# Patient Record
Sex: Male | Born: 1970 | Race: Black or African American | Hispanic: No | Smoking: Current every day smoker
Health system: Southern US, Community
[De-identification: ages and names within clinical notes are randomized; demographics above are authoritative.]

## PROBLEM LIST (undated history)

## (undated) DIAGNOSIS — K859 Acute pancreatitis without necrosis or infection, unspecified: Secondary | ICD-10-CM

## (undated) DIAGNOSIS — F1010 Alcohol abuse, uncomplicated: Secondary | ICD-10-CM

## (undated) DIAGNOSIS — Z720 Tobacco use: Secondary | ICD-10-CM

---

## 2002-07-18 ENCOUNTER — Emergency Department (HOSPITAL_COMMUNITY): Admission: EM | Admit: 2002-07-18 | Discharge: 2002-07-18 | Payer: Self-pay | Admitting: Emergency Medicine

## 2005-01-27 ENCOUNTER — Emergency Department: Payer: Self-pay | Admitting: Emergency Medicine

## 2006-01-12 ENCOUNTER — Emergency Department: Payer: Self-pay | Admitting: Emergency Medicine

## 2007-12-02 ENCOUNTER — Emergency Department: Payer: Self-pay | Admitting: Emergency Medicine

## 2007-12-02 ENCOUNTER — Other Ambulatory Visit: Payer: Self-pay

## 2007-12-04 ENCOUNTER — Inpatient Hospital Stay: Payer: Self-pay | Admitting: Internal Medicine

## 2008-07-11 ENCOUNTER — Emergency Department: Payer: Self-pay | Admitting: Emergency Medicine

## 2010-02-12 ENCOUNTER — Emergency Department (HOSPITAL_COMMUNITY): Admission: EM | Admit: 2010-02-12 | Discharge: 2010-02-13 | Payer: Self-pay | Admitting: Emergency Medicine

## 2010-08-21 LAB — COMPREHENSIVE METABOLIC PANEL
Alkaline Phosphatase: 47 U/L (ref 39–117)
BUN: 3 mg/dL — ABNORMAL LOW (ref 6–23)
Calcium: 9.2 mg/dL (ref 8.4–10.5)
Creatinine, Ser: 0.89 mg/dL (ref 0.4–1.5)
Glucose, Bld: 89 mg/dL (ref 70–99)
Total Protein: 7.1 g/dL (ref 6.0–8.3)

## 2010-08-21 LAB — CBC
HCT: 42.9 % (ref 39.0–52.0)
MCHC: 35.4 g/dL (ref 30.0–36.0)
MCV: 99.5 fL (ref 78.0–100.0)
RDW: 13.4 % (ref 11.5–15.5)

## 2010-08-21 LAB — DIFFERENTIAL
Basophils Absolute: 0 10*3/uL (ref 0.0–0.1)
Eosinophils Absolute: 0.1 10*3/uL (ref 0.0–0.7)
Eosinophils Relative: 2 % (ref 0–5)
Lymphs Abs: 0.9 10*3/uL (ref 0.7–4.0)
Monocytes Absolute: 0.3 10*3/uL (ref 0.1–1.0)
Neutro Abs: 1.9 10*3/uL (ref 1.7–7.7)
Neutrophils Relative %: 59 % (ref 43–77)

## 2010-08-21 LAB — URINALYSIS, ROUTINE W REFLEX MICROSCOPIC
Bilirubin Urine: NEGATIVE
Ketones, ur: 15 mg/dL — AB
Nitrite: NEGATIVE
Specific Gravity, Urine: 1.02 (ref 1.005–1.030)
Urobilinogen, UA: 0.2 mg/dL (ref 0.0–1.0)

## 2010-08-21 LAB — SALICYLATE LEVEL: Salicylate Lvl: <4 mg/dL (ref 2.8–20.0)

## 2010-08-21 LAB — RAPID URINE DRUG SCREEN, HOSP PERFORMED
Amphetamines: NOT DETECTED
Cocaine: NOT DETECTED
Opiates: NOT DETECTED
Tetrahydrocannabinol: NOT DETECTED

## 2010-08-21 LAB — URINE MICROSCOPIC-ADD ON

## 2018-04-21 ENCOUNTER — Emergency Department
Admission: EM | Admit: 2018-04-21 | Discharge: 2018-04-21 | Disposition: A | Discharge status: Home / Self Care | Payer: No Typology Code available for payment source | Attending: Emergency Medicine | Admitting: Emergency Medicine

## 2018-04-21 ENCOUNTER — Encounter: Payer: Self-pay | Admitting: Emergency Medicine

## 2018-04-21 ENCOUNTER — Other Ambulatory Visit: Payer: Self-pay

## 2018-04-21 ENCOUNTER — Emergency Department: Payer: No Typology Code available for payment source

## 2018-04-21 DIAGNOSIS — Y92410 Unspecified street and highway as the place of occurrence of the external cause: Secondary | ICD-10-CM | POA: Insufficient documentation

## 2018-04-21 DIAGNOSIS — S39012A Strain of muscle, fascia and tendon of lower back, initial encounter: Secondary | ICD-10-CM | POA: Diagnosis not present

## 2018-04-21 DIAGNOSIS — F17210 Nicotine dependence, cigarettes, uncomplicated: Secondary | ICD-10-CM | POA: Diagnosis not present

## 2018-04-21 DIAGNOSIS — Y9389 Activity, other specified: Secondary | ICD-10-CM | POA: Diagnosis not present

## 2018-04-21 DIAGNOSIS — S161XXA Strain of muscle, fascia and tendon at neck level, initial encounter: Secondary | ICD-10-CM | POA: Insufficient documentation

## 2018-04-21 DIAGNOSIS — Y999 Unspecified external cause status: Secondary | ICD-10-CM | POA: Insufficient documentation

## 2018-04-21 DIAGNOSIS — S199XXA Unspecified injury of neck, initial encounter: Secondary | ICD-10-CM | POA: Diagnosis present

## 2018-04-21 MED ORDER — MELOXICAM 15 MG PO TABS: 15.0000 mg | ORAL_TABLET | Freq: Every day | ORAL | 2 refills | Status: AC

## 2018-04-21 MED ORDER — BACLOFEN 10 MG PO TABS: 10.0000 mg | ORAL_TABLET | Freq: Every day | ORAL | 1 refills | Status: AC

## 2018-04-21 MED ORDER — CYCLOBENZAPRINE HCL 10 MG PO TABS
10.0000 mg | ORAL_TABLET | Freq: Once | ORAL | Status: AC
  Administered 2018-04-21: 10 mg via ORAL
  Filled 2018-04-21: qty 1
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
## 2018-04-21 NOTE — Discharge Instructions (Addendum)
Follow-up with your regular doctor or Dr. Odis LusterBowers if not better in 5 to 7 days.  Return emergency department worsening.  Use medication as prescribed.  Ice to the lower back and neck as needed for pain.  Be active so you will not become stiff.
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
## 2022-06-10 ENCOUNTER — Emergency Department (HOSPITAL_COMMUNITY): Payer: BLUE CROSS/BLUE SHIELD

## 2022-06-10 ENCOUNTER — Other Ambulatory Visit: Payer: Self-pay

## 2022-06-10 ENCOUNTER — Inpatient Hospital Stay (HOSPITAL_COMMUNITY)
Admission: EM | Admit: 2022-06-10 | Discharge: 2022-06-25 | DRG: 871 | Disposition: A | Discharge status: Home Health | Payer: BLUE CROSS/BLUE SHIELD | Attending: Internal Medicine | Admitting: Internal Medicine

## 2022-06-10 ENCOUNTER — Encounter (HOSPITAL_COMMUNITY): Payer: Self-pay

## 2022-06-10 DIAGNOSIS — D6489 Other specified anemias: Secondary | ICD-10-CM | POA: Diagnosis not present

## 2022-06-10 DIAGNOSIS — L8990 Pressure ulcer of unspecified site, unspecified stage: Secondary | ICD-10-CM | POA: Insufficient documentation

## 2022-06-10 DIAGNOSIS — I4719 Other supraventricular tachycardia: Secondary | ICD-10-CM | POA: Diagnosis not present

## 2022-06-10 DIAGNOSIS — N179 Acute kidney failure, unspecified: Secondary | ICD-10-CM

## 2022-06-10 DIAGNOSIS — A403 Sepsis due to Streptococcus pneumoniae: Secondary | ICD-10-CM | POA: Diagnosis not present

## 2022-06-10 DIAGNOSIS — N400 Enlarged prostate without lower urinary tract symptoms: Secondary | ICD-10-CM | POA: Diagnosis present

## 2022-06-10 DIAGNOSIS — M109 Gout, unspecified: Secondary | ICD-10-CM | POA: Diagnosis present

## 2022-06-10 DIAGNOSIS — I129 Hypertensive chronic kidney disease with stage 1 through stage 4 chronic kidney disease, or unspecified chronic kidney disease: Secondary | ICD-10-CM | POA: Diagnosis present

## 2022-06-10 DIAGNOSIS — I4891 Unspecified atrial fibrillation: Secondary | ICD-10-CM | POA: Diagnosis not present

## 2022-06-10 DIAGNOSIS — F1020 Alcohol dependence, uncomplicated: Secondary | ICD-10-CM | POA: Diagnosis present

## 2022-06-10 DIAGNOSIS — R7881 Bacteremia: Secondary | ICD-10-CM | POA: Diagnosis present

## 2022-06-10 DIAGNOSIS — A491 Streptococcal infection, unspecified site: Secondary | ICD-10-CM | POA: Diagnosis present

## 2022-06-10 DIAGNOSIS — R4182 Altered mental status, unspecified: Secondary | ICD-10-CM | POA: Diagnosis present

## 2022-06-10 DIAGNOSIS — N182 Chronic kidney disease, stage 2 (mild): Secondary | ICD-10-CM | POA: Diagnosis present

## 2022-06-10 DIAGNOSIS — K760 Fatty (change of) liver, not elsewhere classified: Secondary | ICD-10-CM | POA: Diagnosis present

## 2022-06-10 DIAGNOSIS — R197 Diarrhea, unspecified: Secondary | ICD-10-CM | POA: Diagnosis present

## 2022-06-10 DIAGNOSIS — R54 Age-related physical debility: Secondary | ICD-10-CM | POA: Diagnosis present

## 2022-06-10 DIAGNOSIS — N170 Acute kidney failure with tubular necrosis: Secondary | ICD-10-CM | POA: Diagnosis present

## 2022-06-10 DIAGNOSIS — D61818 Other pancytopenia: Secondary | ICD-10-CM | POA: Diagnosis present

## 2022-06-10 DIAGNOSIS — E8809 Other disorders of plasma-protein metabolism, not elsewhere classified: Secondary | ICD-10-CM | POA: Diagnosis present

## 2022-06-10 DIAGNOSIS — R34 Anuria and oliguria: Secondary | ICD-10-CM | POA: Diagnosis present

## 2022-06-10 DIAGNOSIS — K219 Gastro-esophageal reflux disease without esophagitis: Secondary | ICD-10-CM | POA: Diagnosis present

## 2022-06-10 DIAGNOSIS — R531 Weakness: Secondary | ICD-10-CM

## 2022-06-10 DIAGNOSIS — A419 Sepsis, unspecified organism: Principal | ICD-10-CM

## 2022-06-10 DIAGNOSIS — Z681 Body mass index (BMI) 19 or less, adult: Secondary | ICD-10-CM

## 2022-06-10 DIAGNOSIS — G9341 Metabolic encephalopathy: Secondary | ICD-10-CM | POA: Diagnosis present

## 2022-06-10 DIAGNOSIS — M19071 Primary osteoarthritis, right ankle and foot: Secondary | ICD-10-CM | POA: Diagnosis present

## 2022-06-10 DIAGNOSIS — J13 Pneumonia due to Streptococcus pneumoniae: Secondary | ICD-10-CM | POA: Diagnosis present

## 2022-06-10 DIAGNOSIS — R188 Other ascites: Secondary | ICD-10-CM | POA: Diagnosis present

## 2022-06-10 DIAGNOSIS — F17210 Nicotine dependence, cigarettes, uncomplicated: Secondary | ICD-10-CM | POA: Diagnosis present

## 2022-06-10 DIAGNOSIS — J9601 Acute respiratory failure with hypoxia: Secondary | ICD-10-CM | POA: Diagnosis not present

## 2022-06-10 DIAGNOSIS — L89312 Pressure ulcer of right buttock, stage 2: Secondary | ICD-10-CM | POA: Diagnosis not present

## 2022-06-10 DIAGNOSIS — M879 Osteonecrosis, unspecified: Secondary | ICD-10-CM | POA: Diagnosis present

## 2022-06-10 DIAGNOSIS — J189 Pneumonia, unspecified organism: Secondary | ICD-10-CM

## 2022-06-10 DIAGNOSIS — Z8711 Personal history of peptic ulcer disease: Secondary | ICD-10-CM

## 2022-06-10 DIAGNOSIS — E440 Moderate protein-calorie malnutrition: Secondary | ICD-10-CM | POA: Diagnosis present

## 2022-06-10 DIAGNOSIS — K5900 Constipation, unspecified: Secondary | ICD-10-CM | POA: Diagnosis not present

## 2022-06-10 DIAGNOSIS — Z1152 Encounter for screening for COVID-19: Secondary | ICD-10-CM

## 2022-06-10 DIAGNOSIS — F1010 Alcohol abuse, uncomplicated: Secondary | ICD-10-CM | POA: Diagnosis present

## 2022-06-10 DIAGNOSIS — R6520 Severe sepsis without septic shock: Secondary | ICD-10-CM | POA: Diagnosis present

## 2022-06-10 HISTORY — DX: Tobacco use: Z72.0

## 2022-06-10 HISTORY — DX: Alcohol abuse, uncomplicated: F10.10

## 2022-06-10 HISTORY — DX: Acute pancreatitis without necrosis or infection, unspecified: K85.90

## 2022-06-10 LAB — DIFFERENTIAL
Abs Immature Granulocytes: 0.02 10*3/uL (ref 0.00–0.07)
Basophils Absolute: 0 10*3/uL (ref 0.0–0.1)
Basophils Relative: 1 %
Eosinophils Absolute: 0 10*3/uL (ref 0.0–0.5)
Eosinophils Relative: 0 %
Immature Granulocytes: 1 %
Lymphocytes Relative: 6 %
Lymphs Abs: 0.1 10*3/uL — ABNORMAL LOW (ref 0.7–4.0)
Monocytes Absolute: 0.1 10*3/uL (ref 0.1–1.0)
Monocytes Relative: 5 %
Neutro Abs: 1.4 10*3/uL — ABNORMAL LOW (ref 1.7–7.7)
Neutrophils Relative %: 87 %
WBC Morphology: INCREASED

## 2022-06-10 LAB — CBC
HCT: 33.5 % — ABNORMAL LOW (ref 39.0–52.0)
Hemoglobin: 11.8 g/dL — ABNORMAL LOW (ref 13.0–17.0)
MCH: 33.9 pg (ref 26.0–34.0)
MCHC: 35.2 g/dL (ref 30.0–36.0)
MCV: 96.3 fL (ref 80.0–100.0)
Platelets: 119 10*3/uL — ABNORMAL LOW (ref 150–400)
RBC: 3.48 MIL/uL — ABNORMAL LOW (ref 4.22–5.81)
RDW: 14.4 % (ref 11.5–15.5)
WBC: 1.7 10*3/uL — ABNORMAL LOW (ref 4.0–10.5)
nRBC: 0 % (ref 0.0–0.2)

## 2022-06-10 LAB — COMPREHENSIVE METABOLIC PANEL
ALT: 23 U/L (ref 0–44)
AST: 62 U/L — ABNORMAL HIGH (ref 15–41)
Albumin: 3.1 g/dL — ABNORMAL LOW (ref 3.5–5.0)
Alkaline Phosphatase: 20 U/L — ABNORMAL LOW (ref 38–126)
Anion gap: 18 — ABNORMAL HIGH (ref 5–15)
BUN: 94 mg/dL — ABNORMAL HIGH (ref 6–20)
CO2: 18 mmol/L — ABNORMAL LOW (ref 22–32)
Calcium: 8.4 mg/dL — ABNORMAL LOW (ref 8.9–10.3)
Chloride: 99 mmol/L (ref 98–111)
Creatinine, Ser: 7.74 mg/dL — ABNORMAL HIGH (ref 0.61–1.24)
GFR, Estimated: 8 mL/min — ABNORMAL LOW (ref >60)
Glucose, Bld: 130 mg/dL — ABNORMAL HIGH (ref 70–99)
Potassium: 4.2 mmol/L (ref 3.5–5.1)
Sodium: 135 mmol/L (ref 135–145)
Total Bilirubin: 2.8 mg/dL — ABNORMAL HIGH (ref 0.3–1.2)
Total Protein: 7.5 g/dL (ref 6.5–8.1)

## 2022-06-10 LAB — LIPASE, BLOOD: Lipase: 30 U/L (ref 11–51)

## 2022-06-10 MED ORDER — SODIUM CHLORIDE 0.9 % IV SOLN
500.0000 mg | INTRAVENOUS | Status: DC
  Administered 2022-06-11: 500 mg via INTRAVENOUS
  Filled 2022-06-10: qty 5

## 2022-06-10 MED ORDER — LACTATED RINGERS IV BOLUS (SEPSIS)
1000.0000 mL | Freq: Once | INTRAVENOUS | Status: AC
  Administered 2022-06-11: 1000 mL via INTRAVENOUS

## 2022-06-10 MED ORDER — LACTATED RINGERS IV SOLN: INTRAVENOUS | Status: DC

## 2022-06-10 MED ORDER — LACTATED RINGERS IV BOLUS (SEPSIS)
250.0000 mL | Freq: Once | INTRAVENOUS | Status: AC
  Administered 2022-06-11: 250 mL via INTRAVENOUS

## 2022-06-10 MED ORDER — SODIUM CHLORIDE 0.9 % IV SOLN
2.0000 g | INTRAVENOUS | Status: DC
  Administered 2022-06-11: 2 g via INTRAVENOUS
  Filled 2022-06-10: qty 20

## 2022-06-10 MED ORDER — LACTATED RINGERS IV BOLUS (SEPSIS)
500.0000 mL | Freq: Once | INTRAVENOUS | Status: AC
  Administered 2022-06-11: 500 mL via INTRAVENOUS

## 2022-06-10 NOTE — ED Triage Notes (Addendum)
Patient BIB [facility] from home. Patient has abdominal pain for 1 week. Gets dizzy when he stand up. History of pancreatitis. He thinks this is a flare up. Drinks alcohol and last drink was today.  20G left forearm 1 liter of fluid

## 2022-06-10 NOTE — ED Provider Triage Note (Signed)
Emergency Medicine Provider Triage Evaluation Note  James [name] , a 52 y.o. male  was evaluated in triage.  Patient complains of 1 to 2 weeks worth of epigastric pain.  Reports that it feels like his previous pancreatitis.  Reports that he has an extensive alcohol use history, last drink today.  Review of Systems  Positive:  Negative:   Physical Exam  BP (!) 159/75   Pulse (!) 109   Temp 98.5 F (36.9 C) (Axillary)   Resp 17   SpO2 94%  Gen:   Awake, no distress   Resp:  Normal effort  MSK:   Moves extremities without difficulty  Other:  Generalized abdominal tenderness, worse in the epigastrium  Medical Decision Making  Medically screening exam initiated at 12:30 PM.  Appropriate orders placed.  James [name] was informed that the remainder of the evaluation will be completed by another provider, this initial triage assessment does not replace that evaluation, and the importance of remaining in the ED until their evaluation is complete.     James Hammock, PA-C 06/10/22 1231

## 2022-06-11 ENCOUNTER — Inpatient Hospital Stay (HOSPITAL_COMMUNITY): Payer: BLUE CROSS/BLUE SHIELD

## 2022-06-11 ENCOUNTER — Emergency Department (HOSPITAL_COMMUNITY): Payer: BLUE CROSS/BLUE SHIELD

## 2022-06-11 ENCOUNTER — Encounter (HOSPITAL_COMMUNITY): Payer: Self-pay | Admitting: Internal Medicine

## 2022-06-11 DIAGNOSIS — G9341 Metabolic encephalopathy: Secondary | ICD-10-CM | POA: Diagnosis present

## 2022-06-11 DIAGNOSIS — I4891 Unspecified atrial fibrillation: Secondary | ICD-10-CM | POA: Diagnosis not present

## 2022-06-11 DIAGNOSIS — I129 Hypertensive chronic kidney disease with stage 1 through stage 4 chronic kidney disease, or unspecified chronic kidney disease: Secondary | ICD-10-CM | POA: Diagnosis present

## 2022-06-11 DIAGNOSIS — N182 Chronic kidney disease, stage 2 (mild): Secondary | ICD-10-CM | POA: Diagnosis present

## 2022-06-11 DIAGNOSIS — M109 Gout, unspecified: Secondary | ICD-10-CM | POA: Diagnosis present

## 2022-06-11 DIAGNOSIS — F17210 Nicotine dependence, cigarettes, uncomplicated: Secondary | ICD-10-CM | POA: Diagnosis present

## 2022-06-11 DIAGNOSIS — E440 Moderate protein-calorie malnutrition: Secondary | ICD-10-CM | POA: Diagnosis present

## 2022-06-11 DIAGNOSIS — K760 Fatty (change of) liver, not elsewhere classified: Secondary | ICD-10-CM | POA: Diagnosis present

## 2022-06-11 DIAGNOSIS — L89312 Pressure ulcer of right buttock, stage 2: Secondary | ICD-10-CM | POA: Diagnosis not present

## 2022-06-11 DIAGNOSIS — R7881 Bacteremia: Secondary | ICD-10-CM | POA: Diagnosis not present

## 2022-06-11 DIAGNOSIS — J188 Other pneumonia, unspecified organism: Secondary | ICD-10-CM | POA: Diagnosis not present

## 2022-06-11 DIAGNOSIS — A4189 Other specified sepsis: Secondary | ICD-10-CM | POA: Diagnosis not present

## 2022-06-11 DIAGNOSIS — D6489 Other specified anemias: Secondary | ICD-10-CM | POA: Diagnosis not present

## 2022-06-11 DIAGNOSIS — D61818 Other pancytopenia: Secondary | ICD-10-CM | POA: Diagnosis present

## 2022-06-11 DIAGNOSIS — J9601 Acute respiratory failure with hypoxia: Secondary | ICD-10-CM | POA: Diagnosis present

## 2022-06-11 DIAGNOSIS — N170 Acute kidney failure with tubular necrosis: Secondary | ICD-10-CM | POA: Diagnosis present

## 2022-06-11 DIAGNOSIS — J13 Pneumonia due to Streptococcus pneumoniae: Secondary | ICD-10-CM | POA: Diagnosis present

## 2022-06-11 DIAGNOSIS — R6520 Severe sepsis without septic shock: Secondary | ICD-10-CM | POA: Diagnosis present

## 2022-06-11 DIAGNOSIS — E8809 Other disorders of plasma-protein metabolism, not elsewhere classified: Secondary | ICD-10-CM | POA: Diagnosis present

## 2022-06-11 DIAGNOSIS — Z681 Body mass index (BMI) 19 or less, adult: Secondary | ICD-10-CM | POA: Diagnosis not present

## 2022-06-11 DIAGNOSIS — A403 Sepsis due to Streptococcus pneumoniae: Secondary | ICD-10-CM | POA: Diagnosis present

## 2022-06-11 DIAGNOSIS — I4719 Other supraventricular tachycardia: Secondary | ICD-10-CM | POA: Diagnosis not present

## 2022-06-11 DIAGNOSIS — Z1152 Encounter for screening for COVID-19: Secondary | ICD-10-CM | POA: Diagnosis not present

## 2022-06-11 DIAGNOSIS — M879 Osteonecrosis, unspecified: Secondary | ICD-10-CM | POA: Diagnosis present

## 2022-06-11 DIAGNOSIS — F1020 Alcohol dependence, uncomplicated: Secondary | ICD-10-CM | POA: Diagnosis present

## 2022-06-11 DIAGNOSIS — R188 Other ascites: Secondary | ICD-10-CM | POA: Diagnosis present

## 2022-06-11 DIAGNOSIS — L8990 Pressure ulcer of unspecified site, unspecified stage: Secondary | ICD-10-CM | POA: Diagnosis not present

## 2022-06-11 LAB — MAGNESIUM: Magnesium: 2.6 mg/dL — ABNORMAL HIGH (ref 1.7–2.4)

## 2022-06-11 LAB — URINALYSIS, ROUTINE W REFLEX MICROSCOPIC
Bacteria, UA: NONE SEEN
Bilirubin Urine: NEGATIVE
Bilirubin Urine: NEGATIVE
Glucose, UA: 50 mg/dL — AB
Glucose, UA: 50 mg/dL — AB
Ketones, ur: NEGATIVE mg/dL
Ketones, ur: NEGATIVE mg/dL
Leukocytes,Ua: NEGATIVE
Leukocytes,Ua: NEGATIVE
Nitrite: NEGATIVE
Nitrite: NEGATIVE
Protein, ur: 100 mg/dL — AB
Protein, ur: 100 mg/dL — AB
RBC / HPF: >50 RBC/hpf — ABNORMAL HIGH (ref 0–5)
Specific Gravity, Urine: 1.015 (ref 1.005–1.030)
Specific Gravity, Urine: 1.015 (ref 1.005–1.030)
pH: 5 (ref 5.0–8.0)
pH: 5 (ref 5.0–8.0)

## 2022-06-11 LAB — CBC WITH DIFFERENTIAL/PLATELET
Abs Immature Granulocytes: 0.01 10*3/uL (ref 0.00–0.07)
Basophils Absolute: 0 10*3/uL (ref 0.0–0.1)
Basophils Relative: 0 %
Eosinophils Absolute: 0 10*3/uL (ref 0.0–0.5)
Eosinophils Relative: 0 %
HCT: 34 % — ABNORMAL LOW (ref 39.0–52.0)
Hemoglobin: 11.8 g/dL — ABNORMAL LOW (ref 13.0–17.0)
Immature Granulocytes: 0 %
Lymphocytes Relative: 3 %
Lymphs Abs: 0.1 10*3/uL — ABNORMAL LOW (ref 0.7–4.0)
MCH: 33.4 pg (ref 26.0–34.0)
MCHC: 34.7 g/dL (ref 30.0–36.0)
MCV: 96.3 fL (ref 80.0–100.0)
Monocytes Absolute: 0.1 10*3/uL (ref 0.1–1.0)
Monocytes Relative: 2 %
Neutro Abs: 2.9 10*3/uL (ref 1.7–7.7)
Neutrophils Relative %: 95 %
Platelets: 119 10*3/uL — ABNORMAL LOW (ref 150–400)
RBC: 3.53 MIL/uL — ABNORMAL LOW (ref 4.22–5.81)
RDW: 14.6 % (ref 11.5–15.5)
WBC: 3.1 10*3/uL — ABNORMAL LOW (ref 4.0–10.5)
nRBC: 0 % (ref 0.0–0.2)

## 2022-06-11 LAB — RAPID URINE DRUG SCREEN, HOSP PERFORMED
Amphetamines: NOT DETECTED
Barbiturates: NOT DETECTED
Benzodiazepines: NOT DETECTED
Cocaine: NOT DETECTED
Opiates: NOT DETECTED
Tetrahydrocannabinol: NOT DETECTED

## 2022-06-11 LAB — BLOOD GAS, ARTERIAL
Acid-base deficit: 8.3 mmol/L — ABNORMAL HIGH (ref 0.0–2.0)
Bicarbonate: 15.6 mmol/L — ABNORMAL LOW (ref 20.0–28.0)
Drawn by: 270211
FIO2: 40 %
O2 Saturation: 99 %
Patient temperature: 37
pCO2 arterial: 27 mmHg — ABNORMAL LOW (ref 32–48)
pH, Arterial: 7.37 (ref 7.35–7.45)
pO2, Arterial: 96 mmHg (ref 83–108)

## 2022-06-11 LAB — BLOOD CULTURE ID PANEL (REFLEXED) - BCID2

## 2022-06-11 LAB — COMPREHENSIVE METABOLIC PANEL
ALT: 21 U/L (ref 0–44)
AST: 104 U/L — ABNORMAL HIGH (ref 15–41)
Albumin: 2.5 g/dL — ABNORMAL LOW (ref 3.5–5.0)
Alkaline Phosphatase: 17 U/L — ABNORMAL LOW (ref 38–126)
Anion gap: >20 — ABNORMAL HIGH (ref 5–15)
BUN: 123 mg/dL — ABNORMAL HIGH (ref 6–20)
CO2: 16 mmol/L — ABNORMAL LOW (ref 22–32)
Calcium: 8 mg/dL — ABNORMAL LOW (ref 8.9–10.3)
Chloride: 100 mmol/L (ref 98–111)
Creatinine, Ser: 8.84 mg/dL — ABNORMAL HIGH (ref 0.61–1.24)
GFR, Estimated: 7 mL/min — ABNORMAL LOW (ref >60)
Glucose, Bld: 122 mg/dL — ABNORMAL HIGH (ref 70–99)
Potassium: 4.8 mmol/L (ref 3.5–5.1)
Sodium: 137 mmol/L (ref 135–145)
Total Bilirubin: 1.8 mg/dL — ABNORMAL HIGH (ref 0.3–1.2)
Total Protein: 6.7 g/dL (ref 6.5–8.1)

## 2022-06-11 LAB — C-REACTIVE PROTEIN: CRP: 24.1 mg/dL — ABNORMAL HIGH (ref <1.0)

## 2022-06-11 LAB — PHOSPHORUS: Phosphorus: 6.4 mg/dL — ABNORMAL HIGH (ref 2.5–4.6)

## 2022-06-11 LAB — BLOOD GAS, VENOUS
Acid-base deficit: 16.3 mmol/L — ABNORMAL HIGH (ref 0.0–2.0)
Bicarbonate: 8.8 mmol/L — ABNORMAL LOW (ref 20.0–28.0)
O2 Saturation: 84.2 %
Patient temperature: 37
pCO2, Ven: 20 mmHg — ABNORMAL LOW (ref 44–60)
pH, Ven: 7.25 (ref 7.25–7.43)
pO2, Ven: 56 mmHg — ABNORMAL HIGH (ref 32–45)

## 2022-06-11 LAB — PROTIME-INR
INR: 1.4 — ABNORMAL HIGH (ref 0.8–1.2)
Prothrombin Time: 17.2 seconds — ABNORMAL HIGH (ref 11.4–15.2)

## 2022-06-11 LAB — LACTIC ACID, PLASMA
Lactic Acid, Venous: 2.9 mmol/L (ref 0.5–1.9)
Lactic Acid, Venous: 1.5 mmol/L (ref 0.5–1.9)

## 2022-06-11 LAB — RESP PANEL BY RT-PCR (RSV, FLU A&B, COVID)  RVPGX2
Influenza A by PCR: NEGATIVE
Influenza B by PCR: NEGATIVE
Resp Syncytial Virus by PCR: NEGATIVE
SARS Coronavirus 2 by RT PCR: NEGATIVE

## 2022-06-11 LAB — ETHANOL: Alcohol, Ethyl (B): <10 mg/dL (ref <10)

## 2022-06-11 LAB — APTT: aPTT: 33 seconds (ref 24–36)

## 2022-06-11 LAB — HIV ANTIBODY (ROUTINE TESTING W REFLEX): HIV Screen 4th Generation wRfx: NONREACTIVE

## 2022-06-11 LAB — TSH: TSH: 0.872 u[IU]/mL (ref 0.350–4.500)

## 2022-06-11 LAB — CREATININE, URINE, RANDOM: Creatinine, Urine: 101 mg/dL

## 2022-06-11 LAB — GLUCOSE, CAPILLARY: Glucose-Capillary: 121 mg/dL — ABNORMAL HIGH (ref 70–99)

## 2022-06-11 LAB — VITAMIN B12: Vitamin B-12: 287 pg/mL (ref 180–914)

## 2022-06-11 LAB — OSMOLALITY: Osmolality: 329 mOsm/kg (ref 275–295)

## 2022-06-11 LAB — STREP PNEUMONIAE URINARY ANTIGEN: Strep Pneumo Urinary Antigen: POSITIVE — AB

## 2022-06-11 LAB — PROCALCITONIN: Procalcitonin: >150 ng/mL

## 2022-06-11 LAB — SODIUM, URINE, RANDOM: Sodium, Ur: 69 mmol/L

## 2022-06-11 LAB — POC OCCULT BLOOD, ED: Fecal Occult Bld: NEGATIVE

## 2022-06-11 LAB — MRSA NEXT GEN BY PCR, NASAL: MRSA by PCR Next Gen: NOT DETECTED

## 2022-06-11 MED ORDER — ONDANSETRON HCL 4 MG/2ML IJ SOLN
4.0000 mg | Freq: Once | INTRAMUSCULAR | Status: AC
  Administered 2022-06-11: 4 mg via INTRAVENOUS
  Filled 2022-06-11: qty 2

## 2022-06-11 MED ORDER — NICOTINE 21 MG/24HR TD PT24
21.0000 mg | MEDICATED_PATCH | Freq: Every day | TRANSDERMAL | Status: DC
  Administered 2022-06-11 – 2022-06-25 (×15): 21 mg via TRANSDERMAL
  Filled 2022-06-11 (×15): qty 1

## 2022-06-11 MED ORDER — LACTATED RINGERS IV SOLN: INTRAVENOUS | Status: DC

## 2022-06-11 MED ORDER — CHLORHEXIDINE GLUCONATE CLOTH 2 % EX PADS
6.0000 | MEDICATED_PAD | Freq: Every day | CUTANEOUS | Status: DC
  Administered 2022-06-11 – 2022-06-14 (×3): 6 via TOPICAL

## 2022-06-11 MED ORDER — ORAL CARE MOUTH RINSE
15.0000 mL | OROMUCOSAL | Status: DC | PRN
  Administered 2022-06-17: 15 mL via OROMUCOSAL

## 2022-06-11 MED ORDER — IPRATROPIUM-ALBUTEROL 0.5-2.5 (3) MG/3ML IN SOLN
3.0000 mL | Freq: Once | RESPIRATORY_TRACT | Status: AC
  Administered 2022-06-11: 3 mL via RESPIRATORY_TRACT
  Filled 2022-06-11: qty 3

## 2022-06-11 MED ORDER — LOPERAMIDE HCL 2 MG PO CAPS: 2.0000 mg | ORAL_CAPSULE | ORAL | Status: AC | PRN

## 2022-06-11 MED ORDER — LORAZEPAM 0.5 MG PO TABS: 0.5000 mg | ORAL_TABLET | ORAL | Status: DC | PRN

## 2022-06-11 MED ORDER — SODIUM CHLORIDE 0.9 % IV SOLN
1.0000 g | INTRAVENOUS | Status: DC
  Administered 2022-06-11: 1 g via INTRAVENOUS
  Filled 2022-06-11: qty 10

## 2022-06-11 MED ORDER — ACETAMINOPHEN 650 MG RE SUPP: 650.0000 mg | Freq: Four times a day (QID) | RECTAL | Status: DC | PRN

## 2022-06-11 MED ORDER — FENTANYL CITRATE PF 50 MCG/ML IJ SOSY
25.0000 ug | PREFILLED_SYRINGE | Freq: Once | INTRAMUSCULAR | Status: AC
  Administered 2022-06-11: 25 ug via INTRAVENOUS
  Filled 2022-06-11: qty 1

## 2022-06-11 MED ORDER — METRONIDAZOLE 500 MG/100ML IV SOLN
500.0000 mg | Freq: Two times a day (BID) | INTRAVENOUS | Status: DC
  Administered 2022-06-11 – 2022-06-13 (×5): 500 mg via INTRAVENOUS
  Filled 2022-06-11 (×5): qty 100

## 2022-06-11 MED ORDER — ONDANSETRON HCL 4 MG PO TABS
4.0000 mg | ORAL_TABLET | Freq: Four times a day (QID) | ORAL | Status: DC | PRN
  Administered 2022-06-12: 4 mg via ORAL
  Filled 2022-06-11: qty 1

## 2022-06-11 MED ORDER — ORAL CARE MOUTH RINSE
15.0000 mL | OROMUCOSAL | Status: DC
  Administered 2022-06-11 – 2022-06-25 (×43): 15 mL via OROMUCOSAL

## 2022-06-11 MED ORDER — SODIUM CHLORIDE 0.9 % IV SOLN
2.0000 g | INTRAVENOUS | Status: DC
  Administered 2022-06-12 – 2022-06-13 (×2): 2 g via INTRAVENOUS
  Filled 2022-06-11 (×2): qty 20

## 2022-06-11 MED ORDER — FENTANYL CITRATE PF 50 MCG/ML IJ SOSY
12.5000 ug | PREFILLED_SYRINGE | INTRAMUSCULAR | Status: DC | PRN
  Administered 2022-06-11 – 2022-06-17 (×7): 12.5 ug via INTRAVENOUS
  Filled 2022-06-11 (×7): qty 1

## 2022-06-11 MED ORDER — SODIUM BICARBONATE 8.4 % IV SOLN
INTRAVENOUS | Status: DC
  Filled 2022-06-11: qty 1000
  Filled 2022-06-11 (×2): qty 150
  Filled 2022-06-11: qty 1000

## 2022-06-11 MED ORDER — ADULT MULTIVITAMIN W/MINERALS CH
1.0000 | ORAL_TABLET | Freq: Every day | ORAL | Status: DC
  Administered 2022-06-14 – 2022-06-25 (×10): 1 via ORAL
  Filled 2022-06-11 (×13): qty 1

## 2022-06-11 MED ORDER — HYDROXYZINE HCL 25 MG PO TABS: 25.0000 mg | ORAL_TABLET | Freq: Four times a day (QID) | ORAL | Status: AC | PRN

## 2022-06-11 MED ORDER — PANTOPRAZOLE SODIUM 40 MG IV SOLR
40.0000 mg | Freq: Two times a day (BID) | INTRAVENOUS | Status: DC
  Administered 2022-06-11 – 2022-06-17 (×13): 40 mg via INTRAVENOUS
  Filled 2022-06-11 (×13): qty 10

## 2022-06-11 MED ORDER — ACETAMINOPHEN 325 MG PO TABS
650.0000 mg | ORAL_TABLET | Freq: Four times a day (QID) | ORAL | Status: DC | PRN
  Administered 2022-06-12 – 2022-06-24 (×5): 650 mg via ORAL
  Filled 2022-06-11 (×6): qty 2

## 2022-06-11 MED ORDER — FOLIC ACID 1 MG PO TABS
1.0000 mg | ORAL_TABLET | Freq: Every day | ORAL | Status: DC
  Administered 2022-06-11: 1 mg via ORAL
  Filled 2022-06-11: qty 1

## 2022-06-11 MED ORDER — HEPARIN SODIUM (PORCINE) 5000 UNIT/ML IJ SOLN
5000.0000 [IU] | Freq: Three times a day (TID) | INTRAMUSCULAR | Status: DC
  Administered 2022-06-11 – 2022-06-25 (×42): 5000 [IU] via SUBCUTANEOUS
  Filled 2022-06-11 (×42): qty 1

## 2022-06-11 MED ORDER — THIAMINE HCL 100 MG/ML IJ SOLN: 100.0000 mg | Freq: Every day | INTRAMUSCULAR | Status: DC

## 2022-06-11 MED ORDER — CHLORDIAZEPOXIDE HCL 25 MG PO CAPS
25.0000 mg | ORAL_CAPSULE | Freq: Four times a day (QID) | ORAL | Status: AC | PRN
  Administered 2022-06-13: 25 mg via ORAL
  Filled 2022-06-11: qty 1

## 2022-06-11 MED ORDER — ONDANSETRON HCL 4 MG/2ML IJ SOLN: 4.0000 mg | Freq: Four times a day (QID) | INTRAMUSCULAR | Status: DC | PRN

## 2022-06-11 MED ORDER — LINEZOLID 600 MG/300ML IV SOLN
600.0000 mg | Freq: Once | INTRAVENOUS | Status: DC
  Administered 2022-06-11: 600 mg via INTRAVENOUS
  Filled 2022-06-11: qty 300

## 2022-06-11 MED ORDER — LINEZOLID 600 MG/300ML IV SOLN: 600.0000 mg | Freq: Two times a day (BID) | INTRAVENOUS | Status: DC

## 2022-06-11 MED ORDER — LORAZEPAM 1 MG PO TABS
1.0000 mg | ORAL_TABLET | ORAL | Status: DC | PRN
  Administered 2022-06-11: 1 mg via ORAL
  Administered 2022-06-12: 3 mg via ORAL
  Administered 2022-06-13: 2 mg via ORAL
  Filled 2022-06-11: qty 3
  Filled 2022-06-11 (×2): qty 1
  Filled 2022-06-11: qty 2

## 2022-06-11 MED ORDER — ACETAMINOPHEN 325 MG PO TABS
650.0000 mg | ORAL_TABLET | Freq: Once | ORAL | Status: AC
  Administered 2022-06-11: 650 mg via ORAL
  Filled 2022-06-11: qty 2

## 2022-06-11 MED ORDER — THIAMINE MONONITRATE 100 MG PO TABS: 100.0000 mg | ORAL_TABLET | Freq: Every day | ORAL | Status: DC

## 2022-06-11 MED ORDER — THIAMINE HCL 100 MG/ML IJ SOLN
500.0000 mg | INTRAVENOUS | Status: DC
  Administered 2022-06-11 – 2022-06-23 (×12): 500 mg via INTRAVENOUS
  Filled 2022-06-11 (×14): qty 5

## 2022-06-11 NOTE — Progress Notes (Signed)
PHARMACY - PHYSICIAN COMMUNICATION CRITICAL VALUE ALERT - BLOOD CULTURE IDENTIFICATION (BCID)  James [name] is an 52 y.o. male who presented to [facility] on 06/10/2022 with a chief complaint of abdominal pain. PMH significant for alcoholic pancreatitis.  Assessment:  Pt currently on antibiotics for PNA.  BCID + 3/3 strep pneumo  Name of physician (or Provider) Contacted: Dr. Sloan Leiter  Current antibiotics: Cefepime + metronidazole  Changes to prescribed antibiotics: Narrow cefepime to ceftriaxone 2 g IV daily. Continue metronidazole due to concern for aspiration.   Results for orders placed or performed during the hospital encounter of 06/10/22  Blood Culture ID Panel (Reflexed) (Collected: 06/11/2022 12:16 AM)  Result Value Ref Range   Enterococcus faecalis NOT DETECTED NOT DETECTED   Enterococcus Faecium NOT DETECTED NOT DETECTED   Listeria monocytogenes NOT DETECTED NOT DETECTED   Staphylococcus species NOT DETECTED NOT DETECTED   Staphylococcus aureus (BCID) NOT DETECTED NOT DETECTED   Staphylococcus epidermidis NOT DETECTED NOT DETECTED   Staphylococcus lugdunensis NOT DETECTED NOT DETECTED   Streptococcus species DETECTED (A) NOT DETECTED   Streptococcus agalactiae NOT DETECTED NOT DETECTED   Streptococcus pneumoniae DETECTED (A) NOT DETECTED   Streptococcus pyogenes NOT DETECTED NOT DETECTED   A.calcoaceticus-baumannii NOT DETECTED NOT DETECTED   Bacteroides fragilis NOT DETECTED NOT DETECTED   Enterobacterales NOT DETECTED NOT DETECTED   Enterobacter cloacae complex NOT DETECTED NOT DETECTED   Escherichia coli NOT DETECTED NOT DETECTED   Klebsiella aerogenes NOT DETECTED NOT DETECTED   Klebsiella oxytoca NOT DETECTED NOT DETECTED   Klebsiella pneumoniae NOT DETECTED NOT DETECTED   Proteus species NOT DETECTED NOT DETECTED   Salmonella species NOT DETECTED NOT DETECTED   Serratia marcescens NOT DETECTED NOT DETECTED   Haemophilus influenzae NOT DETECTED NOT DETECTED    Neisseria meningitidis NOT DETECTED NOT DETECTED   Pseudomonas aeruginosa NOT DETECTED NOT DETECTED   Stenotrophomonas maltophilia NOT DETECTED NOT DETECTED   Candida albicans NOT DETECTED NOT DETECTED   Candida auris NOT DETECTED NOT DETECTED   Candida glabrata NOT DETECTED NOT DETECTED   Candida krusei NOT DETECTED NOT DETECTED   Candida parapsilosis NOT DETECTED NOT DETECTED   Candida tropicalis NOT DETECTED NOT DETECTED   Cryptococcus neoformans/gattii NOT DETECTED NOT DETECTED    Lenis Noon, PharmD 06/11/2022  4:44 PM

## 2022-06-11 NOTE — Progress Notes (Signed)
Family showed up at beside this evening.  Nira Conn is the sister and "mother/aunt" is Dorethea. Patient did state that Nira Conn was his sister without asking patient to clarify.  Nira Conn stated that mother has limited knowledge in understanding medical procedures or patient's medical situation. Nira Conn stated she could assist mother with conversations with physicians.   Phone numbers have been updated on patient board in room along within medical record under patient contacts.

## 2022-06-11 NOTE — Progress Notes (Addendum)
Per the mother Rufina Falco) and sister Nira Conn) the daughter of patient will make the medical decisions. Will continue to monitor.

## 2022-06-11 NOTE — TOC Initial Note (Signed)
Transition of Care [facility]) - Initial/Assessment Note    Patient Details  Name: James [name] MRN: [id number] Date of Birth: [birth year]  Transition of Care [facility]) CM/SW Contact:    Roseanne Kaufman, RN Phone Number: 06/11/2022, 1:33 PM  Clinical Narrative: Received TOC consult for SA resources, attached to AVS. TOC will continue to follow for discharge needs.                  Expected Discharge Plan: Home/Self Care Barriers to Discharge: Continued Medical Work up   Patient Goals and CMS Choice            Expected Discharge Plan and Services       Living arrangements for the past 2 months: Single Family Home                                      Prior Living Arrangements/Services Living arrangements for the past 2 months: Single Family Home                     Activities of Daily Living      Permission Sought/Granted                  Emotional Assessment              Admission diagnosis:  Acute respiratory failure with hypoxia ([place]) [J96.01] AKI (acute kidney injury) ([place]) [N17.9] Multifocal pneumonia [J18.9] Sepsis, due to unspecified organism, unspecified whether acute organ dysfunction present [facility]) [A41.9] Patient Active Problem List   Diagnosis Date Noted   Acute respiratory failure with hypoxia ([place]) 06/11/2022   PCP:  Patient, No Pcp Per Pharmacy:   [facility] [place], [place] [address] [place] [address] Phone: [phone] Fax: [place], [place] [address] Phone: [phone] Fax: [phone]     Social Determinants of Health (SDOH) Social History: SDOH Screenings   Tobacco Use: High Risk (06/11/2022)   SDOH Interventions:     Readmission Risk Interventions     No data to display

## 2022-06-11 NOTE — Sepsis Progress Note (Signed)
Elink monitoring for the code sepsis protocol.  

## 2022-06-11 NOTE — Consult Note (Signed)
NAME:  James [name], MRN:  [id number], DOB:  March 01, 1971, LOS: 0 ADMISSION DATE:  06/10/2022, CONSULTATION DATE: 1/4 REFERRING MD: Dr. Corrie Mckusick, CHIEF COMPLAINT: Nausea, vomiting  History of Present Illness:  52 year old male who presented to [facility] on 1/3 with reports of upper abdominal pain, nausea and vomiting.  Patient reports he developed abdominal pain for approximately 1 week prior to presentation.  In addition he had nausea and vomiting and subsequently developed shortness of breath.  He is a poor historian with known EtOH abuse.  He reported on presentation prior history of pancreatitis and suspected that this was possibly the same.  In the emergency room he was found to be hypertensive, tachycardic and tachypneic.  Initially on room air he was maintaining saturations but subsequently required heated high flow oxygen.  Initial labs-NA 137, K4.8, CL 100, CO2 16, glucose 122, BUN 123, creatinine 8.84, calcium 8, anion gap greater than 20, phosphorus 6.4, magnesium 2.6, albumin 2.5, AST 104, ALT 21, total bilirubin 1.8, , lipase 30, lactic acid 2.9 and cleared to 1.5 with IV fluid, PCT greater than 150, WBC 3.1, Hgb 11.8, and platelets 119.  Chest x-ray showed right middle lobe and right lower lobe infiltrate.  Subsequent CT imaging of chest abdomen pelvis noted extensive multifocal consolidation throughout the lung bases, no evidence of pancreatitis, and colon fluid-filled to the rectum suggestive of diarrheal illness.  Additional findings of prostamegaly noted.  CT head negative for acute intracranial abnormality.  However did note cerebral atrophy.  Patient was admitted for further evaluation of pneumonia and AKI.  Nephrology was consulted.  He was treated with broad-spectrum antibiotics to include Zyvox, cefepime and Flagyl. COVID, Flu, RSV screening negative.   PCCM consulted for evaluation.  He denies pain, SOB, N/V currently.  Reports if he could not make decisions for  himself, he would want his sister to make them.  States he drinks daily and occasionally gets "the shakes" if he does not drink.   Pertinent  Medical History  Alcohol abuse Pancreatitis Tobacco abuse  Significant Hospital Events: Including procedures, antibiotic start and stop dates in addition to other pertinent events   1/4 Admit with abd pain, N/V, PNA, AKI   Interim History / Subjective:  As above  Objective   Blood pressure 118/81, pulse 94, temperature 98.9 F (37.2 C), temperature source Oral, resp. rate (!) 40, height 5\' 8"  (1.727 m), weight 55.7 kg, SpO2 95 %.    FiO2 (%):  [30 %-60 %] 30 %  No intake or output data in the 24 hours ending 06/11/22 1119 Filed Weights   06/11/22 1008  Weight: 55.7 kg    Examination: General: Thin adult male sitting up in bed in no acute distress HENT: MM pink/moist,  O2, fair dentition, anicteric Lungs: Tachypnea but not labored, right basilar crackles noted clear on left anterior/lateral Cardiovascular: S1-S2 RRR, no MRG Abdomen: Flat, soft, BS x 4 active Extremities: Warm/dry, no edema Neuro: Awake, alert.  Oriented to self, unable to state place voluntarily but agrees that he is in the hospital and stated on exam.  MAE, nonfocal.  Resolved Hospital Problem list     Assessment & Plan:   Abdominal Pain, Nausea & Vomiting, Diarrhea  Hepatic Steatosis  Intermittent vomiting / poor intake for 1week to 1 month. Lipase negative, no evidence of pancreatitis on CT.  -PRN zofran  -CT abd / pelvis noted  -ABX per [facility]   Sepsis with N/V, Multifocal PNA  -s/p IVF  bolus -empiric abx as below  -follow cultures  -trend PCT   Multifocal PNA Acute Hypoxic Respiratory Failure secondary to PNA Suspected aspiration in setting of N/V -continue zyvox, cefepime, flagyl  -wean O2 for sats >90% -pulmonary hygiene  -follow intermittent CXR -consider dedicated CT chest w/o contrast once stable  -at risk intubation  -aspiration  precautions  AKI  Anion Gap Metabolic Acidosis Bladder distended on CT.     -insert foley for bladder decompression -obtain FENa+,  -assess renal US -hold further IVF per Nephrology  -Trend BMP / urinary output -Replace electrolytes as indicated -Avoid nephrotoxic agents, ensure adequate renal perfusion  ETOH Abuse Elevated AST At Risk Withdrawal  -CIWA per TR[facility]  -assess ETOH, UDS -add Librium taper in attempt to avert withdrawal  -folic acid, MVI, high dose thiamine   Tobacco Abuse  -nicotine patch   Best Practice (right click and "Reselect all SmartList Selections" daily)  Per [facility]   Labs   CBC: Recent Labs  Lab 06/10/22 1353 06/10/22 1507 06/11/22 0902  WBC 1.7*  --  3.1*  NEUTROABS  --  1.4* 2.9  HGB 11.8*  --  11.8*  HCT 33.5*  --  34.0*  MCV 96.3  --  96.3  PLT 119*  --  119*    Basic Metabolic Panel: Recent Labs  Lab 06/10/22 1353 06/11/22 0902  NA 135 137  K 4.2 4.8  CL 99 100  CO2 18* 16*  GLUCOSE 130* 122*  BUN 94* 123*  CREATININE 7.74* 8.84*  CALCIUM 8.4* 8.0*  MG  --  2.6*  PHOS  --  6.4*   GFR: Estimated Creatinine Clearance: 7.6 mL/min (A) (by C-G formula based on SCr of 8.84 mg/dL (H)). Recent Labs  Lab 06/10/22 1353 06/11/22 0016 06/11/22 0358 06/11/22 0902  PROCALCITON  --   --   --  >150.00  WBC 1.7*  --   --  3.1*  LATICACIDVEN  --  2.9* 1.5  --     Liver Function Tests: Recent Labs  Lab 06/10/22 1353 06/11/22 0902  AST 62* 104*  ALT 23 21  ALKPHOS 20* 17*  BILITOT 2.8* 1.8*  PROT 7.5 6.7  ALBUMIN 3.1* 2.5*   Recent Labs  Lab 06/10/22 1353  LIPASE 30   No results for input(s): "AMMONIA" in the last 168 hours.  ABG    Component Value Date/Time   PHART 7.37 06/11/2022 0942   PCO2ART 27 (L) 06/11/2022 0942   PO2ART 96 06/11/2022 0942   HCO3 15.6 (L) 06/11/2022 0942   ACIDBASEDEF 8.3 (H) 06/11/2022 0942   O2SAT 99 06/11/2022 0942     Coagulation Profile: Recent Labs  Lab 06/11/22 1027  INR 1.4*     Cardiac Enzymes: No results for input(s): "CKTOTAL", "CKMB", "CKMBINDEX", "TROPONINI" in the last 168 hours.  HbA1C: No results found for: "HGBA1C"  CBG: No results for input(s): "GLUCAP" in the last 168 hours.  Review of Systems:   Unable to complete with patient due to confusion.    Past Medical History:  He,  has a past medical history of Alcohol abuse, Pancreatitis, and Tobacco abuse.   Surgical History:  History reviewed. No pertinent surgical history.   Social History:   reports that he has been smoking cigarettes. He has been smoking an average of 1 pack per day. He has never used smokeless tobacco. He reports current alcohol use. He reports that he does not currently use drugs.   Family History:  His family history is  not on file.   Allergies No Known Allergies   Home Medications  Prior to Admission medications   Not on File     Critical care time: n/a    Noe Gens, MSN, APRN, NP-C, AGACNP-BC [facility] 06/11/2022, 11:51 AM   Please see [email or web address] for pager details.   From 7A-7P if no response, please call [phone] After hours, please call ELink [phone]

## 2022-06-11 NOTE — Progress Notes (Signed)
Pharmacy Antibiotic Note  James [name] is a 52 y.o. male admitted on 06/10/2022 with  sepsis, necrotizing pneumonia .  Pharmacy has been consulted for cefepime dosing. Zyvox per Md. Note patient with AKI, baseline SCr unknown at this time  Plan: Per current renal function, start cefepime 1g IV q24 Zyvox 600mg  IV q12 (avoiding vanc due to renal failure) - d/c zyvox if MRSA PCR negative, Plts 119 so monitor closely given zyvox can lower these further  Height: 5\' 8"  (172.7 cm) Weight: 55.7 kg (122 lb 12.7 oz) IBW/kg (Calculated) : 68.4  Temp (24hrs), Avg:99.4 F (37.4 C), Min:98 F (36.7 C), Max:101.8 F (38.8 C)  Recent Labs  Lab 06/10/22 1353 06/11/22 0016 06/11/22 0358 06/11/22 0902  WBC 1.7*  --   --  3.1*  CREATININE 7.74*  --   --  8.84*  LATICACIDVEN  --  2.9* 1.5  --     Estimated Creatinine Clearance: 7.6 mL/min (A) (by C-G formula based on SCr of 8.84 mg/dL (H)).    No Known Allergies    Thank you for allowing pharmacy to be a part of this patient's care.  Kara Mead 06/11/2022 10:57 AM

## 2022-06-11 NOTE — Sepsis Progress Note (Signed)
Notified bedside nurse of need to draw repeat lactic acid. 

## 2022-06-11 NOTE — ED Provider Notes (Addendum)
[place] DEPT Provider Note   CSN: [id number] Arrival date & time: 06/10/22  1212     History  Chief Complaint  Patient presents with   Abdominal Pain    [place] is a 52 y.o. male.  HPI   Patient with remote history of gastric ulcer disease presents with complaints of stomach pain, patient states stomach pain started about a week ago, states pain is in the middle of his abdomen, does not radiate, remains constant, states he has associated nausea and vomiting without bloody emesis or coffee-ground emesis, he states that his last bowel was a couple days ago, denies any melena or bloody stools, he states that this started after he started to drink alcohol, he states that he was diagnosed with a gastric ulcer back in [place] a few years ago, he states he stopped drinking but then started back up again this is when his pain started, he states he drinks 1-2 beers every week.  He states he has not drank the last week, he has no history of alcohol withdrawals or seizures.  He denies any suicidal Holmes ideations.  He also notes that he is having a slight cough, states is nonproductive, states been having slight fevers and chills, no chest pain or shortness of breath no general body aches.  He is having no other complaints.    Home Medications Prior to Admission medications   Not on File      Allergies    Patient has no known allergies.    Review of Systems   Review of Systems  Constitutional:  Negative for chills and fever.  Respiratory:  Negative for shortness of breath.   Cardiovascular:  Negative for chest pain.  Gastrointestinal:  Positive for abdominal pain, constipation, nausea and vomiting.  Neurological:  Negative for headaches.    Physical Exam Updated Vital Signs BP (!) 119/92   Pulse 94   Temp 98 F (36.7 C) (Oral)   Resp (!) 35   SpO2 99%  Physical Exam Vitals and nursing note reviewed.  Constitutional:      General:  He is not in acute distress.    Appearance: He is ill-appearing.     Comments: Patient is ill-appearing, deconditioned state.  HENT:     Head: Normocephalic and atraumatic.     Nose: No congestion.     Mouth/Throat:     Mouth: Mucous membranes are moist.     Pharynx: Oropharynx is clear.  Eyes:     Extraocular Movements: Extraocular movements intact.     Conjunctiva/sclera: Conjunctivae normal.     Pupils: Pupils are equal, round, and reactive to light.  Cardiovascular:     Rate and Rhythm: Normal rate and regular rhythm.     Pulses: Normal pulses.     Heart sounds: No murmur heard.    No friction rub. No gallop.  Pulmonary:     Effort: No respiratory distress.     Breath sounds: No wheezing, rhonchi or rales.     Comments: Patient on room air was in the low 90s/upper 80s, slightly tachypneic, but is able to speak in full sentences, patient had notable rhonchi and decreased breath sounds in the right lobe, no rales or wheezing present.  Abdominal:     Palpations: Abdomen is soft.     Tenderness: There is no abdominal tenderness. There is no right CVA tenderness or left CVA tenderness.     Comments: Abdomen nondistended, soft there is no guarding or  tension peritoneal sign negative Murphy sign McBurney point.  Musculoskeletal:     Comments: Spine was palpated was nontender to palpation no step-off deformities noted.  Skin:    General: Skin is warm and dry.  Neurological:     Mental Status: He is alert.     Comments: Cranial nerves II through XII grossly intact no difficulty with word finding following two-step commands there is no unilateral weakness present.  Psychiatric:        Mood and Affect: Mood normal.     Comments: Patient is alert and orient x 4 but appears to get confused throughout her conversation, and take some time to respond, he is endorsing any suicidal Vertell Limber ideations denies any hallucinations or delusions he does not appear to be respond to internal stimuli.      ED Results / Procedures / Treatments   Labs (all labs ordered are listed, but only abnormal results are displayed) Labs Reviewed  COMPREHENSIVE METABOLIC PANEL - Abnormal; Notable for the following components:      Result Value   CO2 18 (*)    Glucose, Bld 130 (*)    BUN 94 (*)    Creatinine, Ser 7.74 (*)    Calcium 8.4 (*)    Albumin 3.1 (*)    AST 62 (*)    Alkaline Phosphatase 20 (*)    Total Bilirubin 2.8 (*)    GFR, Estimated 8 (*)    Anion gap 18 (*)    All other components within normal limits  CBC - Abnormal; Notable for the following components:   WBC 1.7 (*)    RBC 3.48 (*)    Hemoglobin 11.8 (*)    HCT 33.5 (*)    Platelets 119 (*)    All other components within normal limits  URINALYSIS, ROUTINE W REFLEX MICROSCOPIC - Abnormal; Notable for the following components:   Color, Urine AMBER (*)    APPearance CLOUDY (*)    Glucose, UA 50 (*)    Hgb urine dipstick MODERATE (*)    Protein, ur 100 (*)    Bacteria, UA MANY (*)    All other components within normal limits  DIFFERENTIAL - Abnormal; Notable for the following components:   Neutro Abs 1.4 (*)    Lymphs Abs 0.1 (*)    All other components within normal limits  LACTIC ACID, PLASMA - Abnormal; Notable for the following components:   Lactic Acid, Venous 2.9 (*)    All other components within normal limits  BLOOD GAS, VENOUS - Abnormal; Notable for the following components:   pCO2, Ven 20 (*)    pO2, Ven 56 (*)    Bicarbonate 8.8 (*)    Acid-base deficit 16.3 (*)    All other components within normal limits  RESP PANEL BY RT-PCR (RSV, FLU A&B, COVID)  RVPGX2  CULTURE, BLOOD (ROUTINE X 2)  CULTURE, BLOOD (ROUTINE X 2)  URINE CULTURE  LIPASE, BLOOD  LACTIC ACID, PLASMA  PROTIME-INR  APTT  POC OCCULT BLOOD, ED    EKG EKG Interpretation  Date/Time:  Wednesday June 10 2022 23:43:06 EST Ventricular Rate:  113 PR Interval:  128 QRS Duration: 94 QT Interval:  308 QTC Calculation: 423 R  Axis:   57 Text Interpretation: Sinus tachycardia Probable anteroseptal infarct, old Confirmed by Gerlene Fee [id number]) on 06/11/2022 12:11:06 AM  Radiology DG Chest Portable 1 View  Result Date: 06/11/2022 CLINICAL DATA:  Increasing shortness of breath EXAM: PORTABLE CHEST 1 VIEW COMPARISON:  06/10/2022 FINDINGS:  Cardiac shadow is within normal limits. Increasing bibasilar consolidation is noted when compared with the prior exam. No bony abnormality is noted. IMPRESSION: Increasing bibasilar consolidation. Electronically Signed   By: Inez Catalina M.D.   On: 06/11/2022 03:00   CT Head Wo Contrast  Result Date: 06/10/2022 CLINICAL DATA:  Dizziness. EXAM: CT HEAD WITHOUT CONTRAST TECHNIQUE: Contiguous axial images were obtained from the base of the skull through the vertex without intravenous contrast. RADIATION DOSE REDUCTION: This exam was performed according to the departmental dose-optimization program which includes automated exposure control, adjustment of the mA and/or kV according to patient size and/or use of iterative reconstruction technique. COMPARISON:  None Available. FINDINGS: Brain: There is mild cerebral atrophy with widening of the extra-axial spaces and ventricular dilatation. There are areas of decreased attenuation within the white matter tracts of the supratentorial brain, consistent with microvascular disease changes. Vascular: No hyperdense vessel or unexpected calcification. Skull: Normal. Negative for fracture or focal lesion. Sinuses/Orbits: No acute finding. Other: None. IMPRESSION: 1. No acute intracranial abnormality. 2. Cerebral atrophy and microvascular disease changes of the supratentorial brain. Electronically Signed   By: Virgina Norfolk M.D.   On: 06/10/2022 23:46   DG Chest 2 View  Result Date: 06/10/2022 CLINICAL DATA:  Shortness of breath and upper abdominal pain. EXAM: CHEST - 2 VIEW COMPARISON:  None Available. FINDINGS: The cardiac silhouette is mildly enlarged.  There is mild calcification of the aortic arch. Moderate severity infiltrates are seen within the right middle lobe and bilateral lung bases. There is no evidence of a pleural effusion or pneumothorax. The visualized skeletal structures are unremarkable. IMPRESSION: Moderate severity right middle lobe and bibasilar infiltrates. Electronically Signed   By: Virgina Norfolk M.D.   On: 06/10/2022 23:34   CT ABDOMEN PELVIS WO CONTRAST  Result Date: 06/10/2022 CLINICAL DATA:  Abdominal pain for 1 week, history of pancreatitis, alcohol use EXAM: CT ABDOMEN AND PELVIS WITHOUT CONTRAST TECHNIQUE: Multidetector CT imaging of the abdomen and pelvis was performed following the standard protocol without IV contrast. RADIATION DOSE REDUCTION: This exam was performed according to the departmental dose-optimization program which includes automated exposure control, adjustment of the mA and/or kV according to patient size and/or use of iterative reconstruction technique. COMPARISON:  12/04/2007 FINDINGS: Lower chest: Very extensive, heterogeneous and consolidative airspace opacity throughout the included lung bases (series 6, image 14). Hepatobiliary: No solid liver abnormality is seen. Hepatic steatosis. No gallstones, gallbladder wall thickening, or biliary dilatation. Pancreas: Unremarkable. No pancreatic ductal dilatation or surrounding inflammatory changes. Spleen: Normal in size without significant abnormality. Adrenals/Urinary Tract: Adrenal glands are unremarkable. Kidneys are normal, without renal calculi, solid lesion, or hydronephrosis. Thickening of the urinary bladder wall. Stomach/Bowel: Stomach is within normal limits. Appendix appears normal. No evidence of bowel wall thickening, distention, or inflammatory changes. The colon is fluid-filled to the rectum. Vascular/Lymphatic: Aortic atherosclerosis. No enlarged abdominal or pelvic lymph nodes. Reproductive: Prostatomegaly. Other: No abdominal wall hernia or  abnormality. No ascites. Musculoskeletal: No acute or significant osseous findings. Severe bilateral femoroacetabular arthrosis, worse on the left with bone-on-bone joint space loss. IMPRESSION: 1. Very extensive, heterogeneous and consolidative airspace opacity throughout the included lung bases, consistent with multifocal infection or aspiration. 2. No imaging evidence of pancreatitis. 3. The colon is fluid-filled to the rectum, suggestive of diarrheal illness. 4. Hepatic steatosis. 5. Prostatomegaly. Aortic Atherosclerosis (ICD10-I70.0). Electronically Signed   By: Delanna Ahmadi M.D.   On: 06/10/2022 17:40    Procedures .Critical Care  Performed by: Ileene Patrick,  Orson Ape, PA-C Authorized by: Marcello Fennel, PA-C   Critical care provider statement:    Critical care time (minutes):  70   Critical care time was exclusive of:  Separately billable procedures and treating other patients   Critical care was necessary to treat or prevent imminent or life-threatening deterioration of the following conditions:  Respiratory failure and sepsis   Critical care was time spent personally by me on the following activities:  Ordering and performing treatments and interventions, ordering and review of laboratory studies, ordering and review of radiographic studies, pulse oximetry, re-evaluation of patient's condition, review of old charts, discussions with consultants, evaluation of patient's response to treatment and examination of patient   I assumed direction of critical care for this patient from another provider in my specialty: no     Care discussed with: admitting provider       Medications Ordered in ED Medications  lactated ringers infusion (0 mLs Intravenous Stopped 06/11/22 0203)  cefTRIAXone (ROCEPHIN) 2 g in sodium chloride 0.9 % 100 mL IVPB (0 g Intravenous Stopped 06/11/22 0101)  azithromycin (ZITHROMAX) 500 mg in sodium chloride 0.9 % 250 mL IVPB (0 mg Intravenous Stopped 06/11/22 0203)  lactated  ringers bolus 1,000 mL (0 mLs Intravenous Stopped 06/11/22 0203)    And  lactated ringers bolus 500 mL (0 mLs Intravenous Stopped 06/11/22 0055)    And  lactated ringers bolus 250 mL (0 mLs Intravenous Stopped 06/11/22 0055)  acetaminophen (TYLENOL) tablet 650 mg (650 mg Oral Given 06/11/22 0054)  ondansetron (ZOFRAN) injection 4 mg (4 mg Intravenous Given 06/11/22 0101)  ipratropium-albuterol (DUONEB) 0.5-2.5 (3) MG/3ML nebulizer solution 3 mL (3 mLs Nebulization Given 06/11/22 0202)  ipratropium-albuterol (DUONEB) 0.5-2.5 (3) MG/3ML nebulizer solution 3 mL (3 mLs Nebulization Given 06/11/22 0231)  fentaNYL (SUBLIMAZE) injection 25 mcg (25 mcg Intravenous Given 06/11/22 0347)    ED Course/ Medical Decision Making/ A&P                           Medical Decision Making Amount and/or Complexity of Data Reviewed Labs: ordered. Radiology: ordered. ECG/medicine tests: ordered.  Risk OTC drugs. Prescription drug management. Decision regarding hospitalization.   This patient presents to the ED for concern of abdominal pain, this involves an extensive number of treatment options, and is a complaint that carries with it a high risk of complications and morbidity.  The differential diagnosis includes obstruction, diverticulitis, volvulus    Additional history obtained:  Additional history obtained from N/A External records from outside source obtained and reviewed including previous ER notes   Co morbidities that complicate the patient evaluation  N/A  Social Determinants of Health:  Does not see a primary care provider    Lab Tests:  I Ordered, and personally interpreted labs.  The pertinent results include: CBC shows leukocytopenia with a white count of 1.7, hemoglobin 11.8, CMP shows CO2 of 18 glucose 130 BUN 94 creatinine 7 AST 62 alk phos 20 T. bili 2.8 GFR 8 anion gap 18, lactic is 30   Imaging Studies ordered:  I ordered imaging studies including CT head, CT AP, x-ray of the  chest I independently visualized and interpreted imaging which showed CT head negative acute findings, CT abdomen negative acute findings, chest x-ray reveals moderate to severe right middle lobe infiltrates I agree with the radiologist interpretation   Cardiac Monitoring:  The patient was maintained on a cardiac monitor.  I personally viewed and interpreted the  cardiac monitored which showed an underlying rhythm of: EKG sinus tach without signs of ischemia   Medicines ordered and prescription drug management:  I ordered medication including fluids I have reviewed the patients home medicines and have made adjustments as needed  Critical Interventions:  1.On exam patient meets sepsis criteria, he is tachycardic, rectal temperature 108, with imaging consistent with multifocal pneumonia, will obtain sepsis lab workup started on broad-spectrum antibiotics  2.  Respiratory failure-currently hypoxic will place on 2 L via nasal cannula we will continue to monitor  Reassessed after placing 2 L O2 sats remain in the mid 90s resting comfortably will continue to monitor.   Reevaluation:  Presents with abdominal pain, triage obtain basic lab work and imaging which I personally reviewed, notable for leukocytopenia, elevated creatinine of unknown timeframe, elevated BUN, anion gap, CT abdomen pelvis revealing multifocal pneumonia.  On my exam there is no focal deficits but he was slightly altered aspect this is likely due to the elevated BUN, will obtain CT head for further evaluation loss of on sepsis workup  I reassessed the patient, he is resting comfortably, I recommend admission he is agreement this plan will consult hospitalist team  Consultations Obtained:  I requested consultation with the nephrologist Dr. Janan Halter ,  and discussed lab and imaging findings as well as pertinent plan - they recommend: Feels patient can remain at [facility] long, unclear of how long patient had this AKI, does not  feel patient needs emergent dialysis at this point, recommends rechecking the creatinine in the morning having hospitalist repaged nephrology. Dr. Sheran Luz of the hospitalist team is capped at this time and the patient will be a carryover for the oncoming team in the morning.    Test Considered:  N/A   Rule out My suspicion for intracranial bleed/intracranial mass is low as CT imaging is negative.  I doubt CVA as there is no focal deficits noted my exam, he is slightly altered but this can be explained by his elevated BUN.  I doubt acute cholecystitis choledocholithiasis as he has no right upper quadrant tenderness, there is only minimal elevation in his AST which I suspect is current changing, he does have an elevated total T. bili but he is not jaundiced on my exam I suspect this is secondary due to possible alcohol consumption.  My suspicion for pancreatitis is low as lipase is unremarkable.  I doubt diverticulitis, pyelo-, kidney stone, AAA, volvulus, CT ab pelvis is negative these findings.  My suspicion for PE is low at this time as he denies any pleuritic chest pain, there is no evidence of unilateral leg swelling calf tenderness, he is tachycardic and is requiring oxygen but with the large right lobe pneumonia this seems consistent and can explain the hypoxia as well as tachycardia.  I do not feel he needs emergent dialysis as there is no elevation in his potassium level, he does not appear to be volume overloaded, he is requiring oxygen but again with this multifocal pneumonia I suspect this is likely the cause, I do not see any evidence of pleural effusion seen on imaging.  Patient does have noted leukocytopenia with low platelet count, his hemoglobin within normal limits so he does not technically meet pancytopenia, he nor his neutrophils are only slightly below, unclear etiology but my suspicion is likely that this is alcohol driven poor oral intake will likely need just further  monitoring.    Dispostion and problem list  After consideration of the diagnostic results  and the patients response to treatment, I feel that the patent would benefit from admission.  Sepsis-secondary due to pneumonia, started on broad-spectrum antibiotics will need continued monitoring. Respiratory failure with hypoxia-secondary due to pneumonia, currently on 2 L via nasal cannula, will likely need weaning till he is on room air. AKI-unclear if this is acute or acute on chronic, will resuscitate the patient, and likely need formal consultation by nephrology in the morning.  Addendum  Notified by nursing staff that patient is becoming more hypoxic and more tachypneic, on my evaluation patient is tachypneic at 30 breaths/min, speaking in short phrases, lung sounds are tight, no notable crackles or rails heard in the lower lobes.  Will DC fluids at this time as I am concerned for possible volume overload new onset of AKI, will place him on heated high flow oxygen, and provide him with a dual neb as he does have a history of tobacco use I suspect there might be a component of bronchial spasms.  Patient was reassessed, while on warm high flow oxygen, respiratory rate has improved, O2 sats have remained in the upper 90s, lung sounds have often improved not as tight sounding, , chest x-ray does not show any evidence of acute pulmonary edema will continue to monitor.   Patient was reassessed again, respiratory rate still is improving, lung sounds have remained unchanged, respiratory therapy has evaluated the patient is able to slowly decrease high flow nasal oxygen.  Lactic has improved, respiratory rate has also improved, patient will be notified by oncoming team should be evaluated by hospitalist team.    Final Clinical Impression(s) / ED Diagnoses Final diagnoses:  Sepsis, due to unspecified organism, unspecified whether acute organ dysfunction present [facility])  Multifocal pneumonia  Acute  respiratory failure with hypoxia ([place])  AKI (acute kidney injury) [facility])    Rx / DC Orders ED Discharge Orders     None         Marcello Fennel, PA-C 06/11/22 0141    Marcello Fennel, PA-C 06/11/22 1021    Maudie Flakes, MD 06/11/22 [phone]

## 2022-06-11 NOTE — H&P (Signed)
History and Physical    Patient: James [name] LZJ:[id number] DOB: Jul 23, 1970 DOA: 06/10/2022 DOS: the patient was seen and examined on 06/11/2022 PCP: Patient, No Pcp Per  Patient coming from: Home-lives with his mother  Chief Complaint:  Chief Complaint  Patient presents with   Abdominal Pain   HPI: James [name] is a 52 y.o. male with significant medical history of of regular alcohol use stated alcoholic pancreatitis as well as a longstanding history of tobacco abuse.  Patient does not go to the doctor and has no primary care physician.  Patient presented to the ER for upper abdominal pain for 1 week he felt was related to a pancreatitis flare.  Vital signs revealed mild hypertension, tachycardia and room air sats of 94%.  While in the emergency department patient had a Tmax of rectally.  His initial WBC was 1700 with slightly low absolute neutrophils at 1.4 and low lymphocytes of 0.1.  As for low at 119,000.  Creatinine was significantly elevated at 7.74 with past documented creatinine of 0.89 in 2011.  Lactic acid was elevated at 2.9, serum CO2 18, BUN 94, AST 62, normal lipase, total bilirubin 2.8.  CT abdomen and pelvis revealed extensive heterogeneous and consolidative airspace opacity throughout the lung bases.  No evidence of pancreatitis or solid liver disease.  He did have hepatic steatosis.  X-ray revealed moderate to severe right middle lobe and bibasilar infiltrates.  Patient did report that he had been having some vomiting throughout his illness and diarrhea early during his illness.  COVID, influenza, RSV PCR's were negative.  Was having episodes of confusion but CT of the head was unremarkable intracranial abnormalities.  He was found to have cerebral atrophy and microvascular disease changes of the supratentorial brain.  Patient has also been requiring high flow nasal cannula oxygen initially at 40 L which is the equivalent to 60% FiO2.  EDP requested hospitalist service for  admission.  Upon my evaluation of the patient he remained tachypneic with respiratory rates between 30 and 49 breaths/min, he was mildly confused but was able to answer simple HPI questions.  He remained on high flow nasal cannula oxygen with the flow rate down to 30 L/min with an FiO2 of 40%.  He has been started on antibiotic therapy.  He was complaining of lower abdominal pain.  He reported he had been sick for at least 1 month and has been vomiting intermittently since that time.  He has had some intermittent diarrhea.  He felt feverish but did not check his temperature.  Confirmed that he does not go to the doctor.  When he presented to triage he admitted to daily alcohol and had actually drunk some alcohol prior to arrival.  To me he denied daily alcohol.  He did admit to smoking since the age of 68.  Subsequently an ABG has been completed revealed compensated metabolic acidosis with a pH of 7.37, pCO2 27, pO2 96 on 40% FiO2 high flow nasal cannula, and acid-base deficit of 8.3 and a bicarbonate of 15.6.  He had also been given fluids in the ER (1750 cc of lactated Ringer's) and lack take acid has normalized.  Phosphorus is 6.4 and magnesium 2.6 with remainder of CMET pending.  Repeat CBC showed improvement in WBCs now 3100.  Platelets remain low at 119,000.  Absolute neutrophils have improved to 2.9 remain elevated at 95, absolute lymphocytes remain low at 0.1-WC morphology consistent with Dohle bodies.  TSH normal at 0.872.  Repeat urinalysis  appears more consistent with dehydration.  Blood cultures and urine cultures remain pending.  At this time patient will be admitted to the stepdown unit.  Attending physician has requested a formal PCCM consultation.  Review of Systems: As mentioned in the history of present illness. All other systems reviewed and are negative. Past Medical History:  Diagnosis Date   Alcohol abuse    Pancreatitis    Tobacco abuse    History reviewed. No pertinent surgical  history. Social History:  reports that he has been smoking cigarettes. He has been smoking an average of 1 pack per day. He has never used smokeless tobacco. He reports current alcohol use. No history on file for drug use.  No Known Allergies  History reviewed. No pertinent family history.  Prior to Admission medications   Not on File    Physical Exam: Vitals:   06/11/22 0615 06/11/22 0700 06/11/22 0749 06/11/22 1008  BP: (!) 119/92 125/84    Pulse: 94 100 92   Resp: (!) 35 (!) 30 (!) 30   Temp:   98.9 F (37.2 C)   TempSrc:   Oral   SpO2: 99% 94% 97%   Weight:    55.7 kg  Height:    5\' 8"  (1.727 m)   Constitutional: NAD, calm, uncomfortable due to lower abd pain-toxic appearing Eyes: PERRL, lids and conjunctivae normal ENMT: Mucous membranes are dry. Neck: normal, supple, no masses, no thyromegaly Respiratory: coarse but clear-decreased in bases, HFNC 30L, tachypneic with increased WOB Cardiovascular: Regular rate and rhythm, no murmurs / rubs / gallops. No extremity edema. 2+ pedal pulses. Extremities warm Abdomen: Soft and nondistended with lower abdominal pain over the suprapubic region with palpation., no masses palpated. No hepatosplenomegaly. Bowel sounds positive.  Musculoskeletal: no clubbing / cyanosis. No joint deformity upper and lower extremities. Good ROM, no contractures. Normal muscle tone.  Skin: no rashes, lesions, ulcers. No induration Neurologic: CN 2-12 grossly intact. Sensation intact. Strength 3/5 x all 4 extremities.  Psychiatric: Lethargic and confused.  Oriented to name only.    Data Reviewed:  See HPI  Assessment and Plan: Sepsis secondary to pneumonia/concerns over possible necrotizing process Sepsis criteria: Fever, source of infection, tachycardia, neutropenia with thrombocytopenia, acute kidney injury, transaminitis all reflective of low perfusion process.  Metabolic acidosis currently compensated. Presented with elevated lactic acid that  improved with hydration but recent ABG concerning for compensated metabolic acidosis. Also has acute kidney injury as well as mild transaminitis that appears to be more related to alcohol abuse with an elevated AST of 104. CRP pending, PCT greater than 150 Broad-spectrum IV antibiotics of Flagyl, Maxipime Zyvox PCCM consulted by attending physician Continue aggressive IV fluid hydration lactated Ringer's at 150 cc/h N.p.o. due to acute encephalopathy secondary to sepsis Follow-up on blood and urine cultures Check urinary strep as well as Legionella  Acute kidney injury Patient was given 1.75 L of lactated Ringer's and was placed on lactated Ringer continuous infusion Unfortunately with repeat blood work this morning CO2 remains decreased at 16 BUN now increased to 123 and creatinine 8.84 with an anion gap greater than 20, magnesium 2.6, phosphorus 6.4, total bilirubin has decreased to 1.8 IV fluid hydration as above.  Place Foley catheter. Strict intake and output Nephrology who was consulted last night has been updated on current labs.  May be a candidate for CRRT  Acute respiratory failure with hypoxemia Continue HFNC Treat underlying causes  Alcohol abuse, tobacco abuse CIWA protocol Atrophy on CT of the  head.  Check B12 and thiamine levels Concern for underlying Warnicke's encephalopathy for will give high-dose IV thiamine 500 mg daily  Advance Care Planning:   Code Status: Full Code   DVT prophylaxis: Subcutaneous heparin  Consults: Nephrology, critical care medicine  Family Communication: Patient only  Severity of Illness: The appropriate patient status for this patient is INPATIENT. Inpatient status is judged to be reasonable and necessary in order to provide the required intensity of service to ensure the patient's safety. The patient's presenting symptoms, physical exam findings, and initial radiographic and laboratory data in the context of their chronic comorbidities is  felt to place them at high risk for further clinical deterioration. Furthermore, it is not anticipated that the patient will be medically stable for discharge from the hospital within 2 midnights of admission.   * I certify that at the point of admission it is my clinical judgment that the patient will require inpatient hospital care spanning beyond 2 midnights from the point of admission due to high intensity of service, high risk for further deterioration and high frequency of surveillance required.*  Author: Erin Hearing, NP 06/11/2022 10:30 AM  For on call review [email or web address].

## 2022-06-11 NOTE — ED Notes (Signed)
Re-draw lactic, sent to lab.

## 2022-06-11 NOTE — Progress Notes (Signed)
Notified MD Royce Macadamia about 78ml of urine output after foley was placed. No new orders at this time. Will continue to monitor and assess. CCM MD Hunsucker and CCM NP Noe Gens made aware as well.

## 2022-06-11 NOTE — Consult Note (Addendum)
[facility] Renal Consultation Note  Requesting MD: Barb Merino, MD Indication for Consultation:  AKI   Chief complaint: abdominal pain, n/v  HPI:  James [name] is a 52 y.o. male with a history of alcohol abuse, pancreatitis, and tobacco abuse who presented to the hospital with a one week duration of abdominal pain which he had attributed to pancreatitis.  He states that he started drinking about 5 months ago after a falling out with his mother around the time that he lost a job.  He states hasn't been eating or drinking much.  Denies NSAID use and states he doesn't take any meds or other substances however is altered.  He has no recent labs for comparison but presented with a creatinine of 7.74 compared with a prior lab of 0.89 in 2011.  CT abdomen/pelvis without contrast demonstrated extensive airspace opacities through the lung bases which was concerning for multifocal pneumonia and concern for aspiration.  He was noted to have prostatomegaly and no hydro.  He had a foley placed with minimal urine output (less than 10 ml per RN).  He received approximately 1.75 L of LR in the ER and then limited duration continuous LR after that.  Nephrology is consulted for assistance with management of AKI.  At the time of consult he was on 30 liters oxygen and has been weaned to 20 liters. Pulmonary/critical care has also been consulted.  Per primary team note he lives with his aunt Renda Rolls.  Both numbers for a Elveria Rising are not in service for me.  He states that he has one adult child - his daughter.  He tries to think of his daughter's number and his sister's number and he cannot think of the numbers.  Though not fully able to consent he does say that he would want dialysis if needed.  Spoke with pulmonary - they favor bicarb gtt today and then dialysis tomorrow if no improvement.    Creatinine, Ser  Date/Time Value Ref Range Status  06/11/2022 09:02 AM 8.84 (H) 0.61 - 1.24 mg/dL  Final  06/10/2022 01:53 PM 7.74 (H) 0.61 - 1.24 mg/dL Final  02/12/2010 09:49 AM 0.89 0.4 - 1.5 mg/dL Final     PMHx:   Past Medical History:  Diagnosis Date   Alcohol abuse    Pancreatitis    Tobacco abuse     History reviewed. No pertinent surgical history.  Family Hx: History reviewed. No pertinent family history. Patient with AMS - unable to obtain   Social History:  reports that he has been smoking cigarettes. He has been smoking an average of 1 pack per day. He has never used smokeless tobacco. He reports current alcohol use. He reports that he does not currently use drugs.  Allergies: No Known Allergies  Medications: Prior to Admission medications   Not on File    I have reviewed the patient's current medications and he reports that he takes no medications at home    Labs:     Latest Ref Rng & Units 06/11/2022    9:02 AM 06/10/2022    1:53 PM 02/12/2010    9:49 AM  BMP  Glucose 70 - 99 mg/dL 122  130  89   BUN 6 - 20 mg/dL 123  94  3   Creatinine 0.61 - 1.24 mg/dL 8.84  7.74  0.89   Sodium 135 - 145 mmol/L 137  135  143   Potassium 3.5 - 5.1 mmol/L 4.8  4.2  4.2  Chloride 98 - 111 mmol/L 100  99  103   CO2 22 - 32 mmol/L 16  18  27    Calcium 8.9 - 10.3 mg/dL 8.0  8.4  9.2     Urinalysis    Component Value Date/Time   COLORURINE AMBER (A) 06/11/2022 0746   APPEARANCEUR CLOUDY (A) 06/11/2022 0746   LABSPEC 1.015 06/11/2022 0746   PHURINE 5.0 06/11/2022 0746   GLUCOSEU 50 (A) 06/11/2022 0746   HGBUR LARGE (A) 06/11/2022 0746   BILIRUBINUR NEGATIVE 06/11/2022 0746   KETONESUR NEGATIVE 06/11/2022 0746   PROTEINUR 100 (A) 06/11/2022 0746   UROBILINOGEN 0.2 02/12/2010 0945   NITRITE NEGATIVE 06/11/2022 0746   LEUKOCYTESUR NEGATIVE 06/11/2022 0746     ROS:  Unable to obtain 2/2 AMS  Physical Exam: Vitals:   06/11/22 1103 06/11/22 1200  BP: 118/81 114/77  Pulse: 94 90  Resp: (!) 40 (!) 39  Temp:    SpO2: 95% 98%     General:  adult male in bed  critically ill   HEENT: NCAT Eyes: EOMI sclera anicteric Neck: supple trachea midline Heart: S1S2 tachycardic Lungs: tachypnea and increased work of breathing with exertion or prolonged speech; decreased breath sounds Abdomen: soft/nt/nd Extremities: no edema no cyanosis or clubbing Skin: no rash on extremities exposed Neuro: oriented to person not to location "I just told him" or to year "year is 7" GU foley in place with less than 10 ml urine   Assessment/Plan:   # Severe multifocal pneumonia - antibiotics per primary team - suspect transcription error in team's addendum continue antibiotics - pulmonology has been consulted   # AKI  - May be secondary to prolonged pre-renal insults leading to ATN.  Setting of alcohol abuse and severe pneumonia.  Hasn't improved thus far with fluids from the ER.  Normotensive here  - No known recent baseline  - I have not been able to reach family to update - see that others haven't been able to, as well  - Trial of sodium bicarb gtt today and then plan for CRRT tomorrow if no improvement - spoke with pulm and we are in agreement  # Acute hypoxic respiratory failure  - requiring high dose supplemental oxygen  - Fluids were paused earlier in light of respiratory status at my request - pulmonology is monitoring closely in the ICU for the need for intubation   # Metabolic acidosis - Starting bicarb gtt - team has sent osm which is pending  - as above low threshold for CRRT   # Abdominal pain and vomiting  - per primary team  - perhaps has led to chronic pre-renal insults as above   # AMS - multifactorial setting of acidosis and AKI, alcohol abuse   # Transaminitis  - Setting of alcohol abuse   # Alcohol abuse  - Monitor for withdrawal per primary team   # hyperphosphatemia  - Secondary to AKI - with resp distress not really taking PO  Disposition - continue ICU monitoring   Claudia Desanctis 06/11/2022, 1:43 PM   RN reports limited  urine output but respiratory status seems improved.  Continue bicarb gtt.  He was also found to have strep pneumoniae bacteremia - antibiotics per primary team.  May have post-strep GN.  Send ANA, ANCA, complement to start   Claudia Desanctis, MD 5:02 PM 06/11/2022

## 2022-06-11 NOTE — Progress Notes (Signed)
Notified MD Hunsucker and MD Royce Macadamia in regards to patient's urine output only being 38ml since insert of foley. MD Hunsucker and MD Royce Macadamia also aware of positive Strep Pneumoniae in blood cultures. Will continue to monitor and assess.

## 2022-06-12 ENCOUNTER — Inpatient Hospital Stay (HOSPITAL_COMMUNITY): Payer: BLUE CROSS/BLUE SHIELD

## 2022-06-12 DIAGNOSIS — R7881 Bacteremia: Secondary | ICD-10-CM | POA: Diagnosis present

## 2022-06-12 DIAGNOSIS — A491 Streptococcal infection, unspecified site: Secondary | ICD-10-CM | POA: Diagnosis present

## 2022-06-12 DIAGNOSIS — J9601 Acute respiratory failure with hypoxia: Secondary | ICD-10-CM | POA: Diagnosis not present

## 2022-06-12 DIAGNOSIS — R4182 Altered mental status, unspecified: Secondary | ICD-10-CM | POA: Diagnosis present

## 2022-06-12 DIAGNOSIS — F1020 Alcohol dependence, uncomplicated: Secondary | ICD-10-CM | POA: Diagnosis present

## 2022-06-12 DIAGNOSIS — N170 Acute kidney failure with tubular necrosis: Secondary | ICD-10-CM | POA: Diagnosis present

## 2022-06-12 DIAGNOSIS — N179 Acute kidney failure, unspecified: Secondary | ICD-10-CM | POA: Diagnosis present

## 2022-06-12 LAB — ANCA PROFILE
Anti-MPO Antibodies: <0.2 units (ref 0.0–0.9)
Anti-PR3 Antibodies: <0.2 units (ref 0.0–0.9)
Atypical P-ANCA titer: <1:20 {titer}
C-ANCA: <1:20 {titer}
P-ANCA: <1:20 {titer}

## 2022-06-12 LAB — COMPREHENSIVE METABOLIC PANEL
ALT: 20 U/L (ref 0–44)
AST: 108 U/L — ABNORMAL HIGH (ref 15–41)
Albumin: 2.1 g/dL — ABNORMAL LOW (ref 3.5–5.0)
Alkaline Phosphatase: 19 U/L — ABNORMAL LOW (ref 38–126)
Anion gap: 18 — ABNORMAL HIGH (ref 5–15)
BUN: 137 mg/dL — ABNORMAL HIGH (ref 6–20)
CO2: 21 mmol/L — ABNORMAL LOW (ref 22–32)
Calcium: 7.5 mg/dL — ABNORMAL LOW (ref 8.9–10.3)
Chloride: 98 mmol/L (ref 98–111)
Creatinine, Ser: 9.26 mg/dL — ABNORMAL HIGH (ref 0.61–1.24)
GFR, Estimated: 6 mL/min — ABNORMAL LOW (ref >60)
Glucose, Bld: 152 mg/dL — ABNORMAL HIGH (ref 70–99)
Potassium: 4.2 mmol/L (ref 3.5–5.1)
Sodium: 137 mmol/L (ref 135–145)
Total Bilirubin: 1 mg/dL (ref 0.3–1.2)
Total Protein: 5.8 g/dL — ABNORMAL LOW (ref 6.5–8.1)

## 2022-06-12 LAB — PROCALCITONIN: Procalcitonin: >150 ng/mL

## 2022-06-12 LAB — CBC WITH DIFFERENTIAL/PLATELET
Abs Immature Granulocytes: 0.02 10*3/uL (ref 0.00–0.07)
Basophils Absolute: 0 10*3/uL (ref 0.0–0.1)
Basophils Relative: 0 %
Eosinophils Absolute: 0 10*3/uL (ref 0.0–0.5)
Eosinophils Relative: 0 %
HCT: 31.4 % — ABNORMAL LOW (ref 39.0–52.0)
Hemoglobin: 11.1 g/dL — ABNORMAL LOW (ref 13.0–17.0)
Immature Granulocytes: 0 %
Lymphocytes Relative: 2 %
Lymphs Abs: 0.1 10*3/uL — ABNORMAL LOW (ref 0.7–4.0)
MCH: 33.5 pg (ref 26.0–34.0)
MCHC: 35.4 g/dL (ref 30.0–36.0)
MCV: 94.9 fL (ref 80.0–100.0)
Monocytes Absolute: 0.1 10*3/uL (ref 0.1–1.0)
Monocytes Relative: 1 %
Neutro Abs: 6.7 10*3/uL (ref 1.7–7.7)
Neutrophils Relative %: 97 %
Platelets: 95 10*3/uL — ABNORMAL LOW (ref 150–400)
RBC: 3.31 MIL/uL — ABNORMAL LOW (ref 4.22–5.81)
RDW: 14.3 % (ref 11.5–15.5)
WBC: 6.9 10*3/uL (ref 4.0–10.5)
nRBC: 0 % (ref 0.0–0.2)

## 2022-06-12 LAB — RENAL FUNCTION PANEL
Albumin: 2.4 g/dL — ABNORMAL LOW (ref 3.5–5.0)
Anion gap: 14 (ref 5–15)
BUN: 98 mg/dL — ABNORMAL HIGH (ref 6–20)
CO2: 24 mmol/L (ref 22–32)
Calcium: 7.8 mg/dL — ABNORMAL LOW (ref 8.9–10.3)
Chloride: 99 mmol/L (ref 98–111)
Creatinine, Ser: 6.74 mg/dL — ABNORMAL HIGH (ref 0.61–1.24)
GFR, Estimated: 9 mL/min — ABNORMAL LOW (ref >60)
Glucose, Bld: 146 mg/dL — ABNORMAL HIGH (ref 70–99)
Phosphorus: 2.5 mg/dL (ref 2.5–4.6)
Potassium: 4 mmol/L (ref 3.5–5.1)
Sodium: 137 mmol/L (ref 135–145)

## 2022-06-12 LAB — URINE CULTURE: Culture: NO GROWTH

## 2022-06-12 LAB — C3 COMPLEMENT: C3 Complement: 105 mg/dL (ref 82–167)

## 2022-06-12 LAB — HEMOGLOBIN A1C
Hgb A1c MFr Bld: 5.5 % (ref 4.8–5.6)
Mean Plasma Glucose: 111 mg/dL

## 2022-06-12 LAB — C4 COMPLEMENT: Complement C4, Body Fluid: 29 mg/dL (ref 12–38)

## 2022-06-12 LAB — ANA: Anti Nuclear Antibody (ANA): NEGATIVE

## 2022-06-12 MED ORDER — ADENOSINE 6 MG/2ML IV SOLN: 12.0000 mg | Freq: Once | INTRAVENOUS | Status: AC

## 2022-06-12 MED ORDER — ADENOSINE 6 MG/2ML IV SOLN
INTRAVENOUS | Status: AC
  Administered 2022-06-12: 12 mg via INTRAVENOUS
  Filled 2022-06-12: qty 2

## 2022-06-12 MED ORDER — AMIODARONE HCL IN DEXTROSE 360-4.14 MG/200ML-% IV SOLN
60.0000 mg/h | INTRAVENOUS | Status: AC
  Administered 2022-06-12 (×2): 60 mg/h via INTRAVENOUS
  Filled 2022-06-12: qty 200

## 2022-06-12 MED ORDER — LORAZEPAM 2 MG/ML IJ SOLN
INTRAMUSCULAR | Status: AC
  Administered 2022-06-12: 1 mg via INTRAVENOUS
  Filled 2022-06-12: qty 1

## 2022-06-12 MED ORDER — AMIODARONE HCL IN DEXTROSE 360-4.14 MG/200ML-% IV SOLN
INTRAVENOUS | Status: AC
  Filled 2022-06-12: qty 200

## 2022-06-12 MED ORDER — PRISMASOL BGK 4/2.5 32-4-2.5 MEQ/L EC SOLN: Status: DC

## 2022-06-12 MED ORDER — LORAZEPAM 2 MG/ML IJ SOLN: 1.0000 mg | Freq: Once | INTRAMUSCULAR | Status: AC

## 2022-06-12 MED ORDER — FOLIC ACID 5 MG/ML IJ SOLN
1.0000 mg | Freq: Every day | INTRAMUSCULAR | Status: DC
  Administered 2022-06-12 – 2022-06-25 (×13): 1 mg via INTRAVENOUS
  Filled 2022-06-12 (×17): qty 0.2

## 2022-06-12 MED ORDER — ADENOSINE 6 MG/2ML IV SOLN: 6.0000 mg | Freq: Once | INTRAVENOUS | Status: AC

## 2022-06-12 MED ORDER — LACTULOSE 10 GM/15ML PO SOLN
20.0000 g | Freq: Two times a day (BID) | ORAL | Status: DC | PRN
  Administered 2022-06-13: 20 g via ORAL
  Filled 2022-06-12: qty 30

## 2022-06-12 MED ORDER — PRISMASOL BGK 4/2.5 32-4-2.5 MEQ/L REPLACEMENT SOLN: Status: DC

## 2022-06-12 MED ORDER — NOREPINEPHRINE 4 MG/250ML-% IV SOLN
0.0000 ug/min | INTRAVENOUS | Status: DC
  Administered 2022-06-12 (×2): 2 ug/min via INTRAVENOUS
  Filled 2022-06-12: qty 250

## 2022-06-12 MED ORDER — ADENOSINE 6 MG/2ML IV SOLN
INTRAVENOUS | Status: AC
  Filled 2022-06-12: qty 2

## 2022-06-12 MED ORDER — NOREPINEPHRINE 4 MG/250ML-% IV SOLN: 0.0000 ug/min | INTRAVENOUS | Status: DC

## 2022-06-12 MED ORDER — AMIODARONE LOAD VIA INFUSION
150.0000 mg | Freq: Once | INTRAVENOUS | Status: AC
  Administered 2022-06-12: 150 mg via INTRAVENOUS
  Filled 2022-06-12: qty 83.34

## 2022-06-12 MED ORDER — AMIODARONE HCL IN DEXTROSE 360-4.14 MG/200ML-% IV SOLN
30.0000 mg/h | INTRAVENOUS | Status: DC
  Administered 2022-06-13 – 2022-06-14 (×5): 30 mg/h via INTRAVENOUS
  Filled 2022-06-12 (×5): qty 200

## 2022-06-12 MED ORDER — HEPARIN SODIUM (PORCINE) 1000 UNIT/ML DIALYSIS: 1000.0000 [IU] | INTRAMUSCULAR | Status: DC | PRN

## 2022-06-12 MED ORDER — ADENOSINE 6 MG/2ML IV SOLN
INTRAVENOUS | Status: AC
  Administered 2022-06-12: 6 mg via INTRAVENOUS
  Filled 2022-06-12: qty 2

## 2022-06-12 NOTE — Progress Notes (Signed)
Primary nurse reached out to James Forster, RN to update team about that patient's u/o. Patient has had a total of 24ml so far during night shift. Will continue to monitor.

## 2022-06-12 NOTE — Progress Notes (Addendum)
PCCM note  Called to the bedside for the narrow complex tachycardia with heart rate in the 190s EKG shows SVT  We gave him Adenosine 6 mg and then 12 mg with transition to sinus rhythm Given amiodarone bolus and drip  Additional CC time 30 mins James Slovacek MD [facility] See Amion for pager  If no response to pager , please call [phone] until 7pm After 7:00 pm call Elink  [phone] 06/12/2022, 4:46 PM

## 2022-06-12 NOTE — TOC Initial Note (Signed)
Transition of Care [facility]) - Initial/Assessment Note    Patient Details  Name: James [name] MRN: [id number] Date of Birth: 1970/09/30  [facility] CM/SW Contact:    Roseanne Kaufman, RN Phone Number: 06/12/2022, 2:01 PM  Clinical Narrative:    This RNCM attempted to reach patient's daughter James [name], spoke with patient's sister James [name] who reports patient did not have any DME PTA.               Transportation at discharge: sister James [name] No current TOC needs at this time.   TOC will continue to follow.  Expected Discharge Plan: Home/Self Care Barriers to Discharge: Continued Medical Work up   Patient Goals and CMS Choice            Expected Discharge Plan and Services       Living arrangements for the past 2 months: Single Family Home                                      Prior Living Arrangements/Services Living arrangements for the past 2 months: Single Family Home                     Activities of Daily Living      Permission Sought/Granted                  Emotional Assessment              Admission diagnosis:  Acute respiratory failure with hypoxia ([place]) [J96.01] AKI (acute kidney injury) ([place]) [N17.9] Multifocal pneumonia [J18.9] Sepsis, due to unspecified organism, unspecified whether acute organ dysfunction present [facility]) [A41.9] Patient Active Problem List   Diagnosis Date Noted   Acute renal failure (ARF) ([place]) 06/12/2022   Bacteremia 06/12/2022   Streptococcus pneumoniae 06/12/2022   Altered mental status 06/12/2022   Alcoholism ([place]) 06/12/2022   Acute respiratory failure with hypoxia ([place]) 06/11/2022   PCP:  Patient, No Pcp Per Pharmacy:   [place] [place] [address] [place] [address] Phone: [phone] Fax: [place], [place] [address] [place] [address] Phone:  [phone] Fax: [phone]     Social Determinants of Health (SDOH) Social History: SDOH Screenings   Tobacco Use: High Risk (06/11/2022)   SDOH Interventions:     Readmission Risk Interventions     No data to display

## 2022-06-12 NOTE — Procedures (Signed)
Central Venous Catheter Insertion Procedure Note  NAMON VILLARIN  [id number]  September 04, 1970  Date:06/12/22  Time:9:10 AM   Provider Performing:Evren Shankland   Procedure: Insertion of Non-tunneled Central Venous Catheter(36556)with US guidance (42706)    Indication(s) Hemodialysis  Consent Risks of the procedure as well as the alternatives and risks of each were explained to the patient and/or caregiver.  Consent for the procedure was obtained and is signed in the bedside chart  Anesthesia Topical only with 1% lidocaine   Timeout Verified patient identification, verified procedure, site/side was marked, verified correct patient position, special equipment/implants available, medications/allergies/relevant history reviewed, required imaging and test results available.  Sterile Technique Maximal sterile technique including full sterile barrier drape, hand hygiene, sterile gown, sterile gloves, mask, hair covering, sterile ultrasound probe cover (if used).  Procedure Description Area of catheter insertion was cleaned with chlorhexidine and draped in sterile fashion.  Ultrasound was used to visualize vein anatomy.   With real-time ultrasound guidance a HD catheter was placed into the right internal jugular vein.  Nonpulsatile blood flow and easy flushing noted in all ports.  The catheter was sutured in place and sterile dressing applied.  Complications/Tolerance None; patient tolerated the procedure well. Chest X-ray is ordered to verify placement for internal jugular or subclavian cannulation.  Chest x-ray is not ordered for femoral cannulation.  EBL Minimal  Specimen(s) None    Marshell Garfinkel MD [facility] See Amion for pager  If no response to pager , please call [phone] until 7pm After 7:00 pm call Elink  [phone] 06/12/2022, 9:11 AM

## 2022-06-12 NOTE — Progress Notes (Signed)
[facility] Progress Note  Name: James [name] MRN: [id number] DOB: January 10, 1971  Chief Complaint:  Abdominal pain, n/v  Subjective:  Spoke with daughter for consent for CRRT earlier this AM.  Primary team placed nontunneled catheter and he was started on CRRT today.  Tolerating per nursing.  Bicarb gtt was stopped.  He has been keep even to net neg 50 ml/hr. He had 75 ml UOP over 1/4. Spoke with his daughter and mother who are at bedside.  He was started on levo and this is now down to 2 mcg/min and nursing thinks may be able to come off soon.   Review of systems:  Patient with shortness of breath  Ams improving  Taking sips of water with nursing supervision   ---------- Background on consult:  James [name] is a 52 y.o. male with a history of alcohol abuse, pancreatitis, and tobacco abuse who presented to the hospital with a one week duration of abdominal pain which he had attributed to pancreatitis.  He states that he started drinking about 5 months ago after a falling out with his mother around the time that he lost a job.  He states hasn't been eating or drinking much.  Denies NSAID use and states he doesn't take any meds or other substances however is altered.  He has no recent labs for comparison but presented with a creatinine of 7.74 compared with a prior lab of 0.89 in 2011.  CT abdomen/pelvis without contrast demonstrated extensive airspace opacities through the lung bases which was concerning for multifocal pneumonia and concern for aspiration.  He was noted to have prostatomegaly and no hydro.  He had a foley placed with minimal urine output (less than 10 ml per RN).  He received approximately 1.75 L of LR in the ER and then limited duration continuous LR after that.  Nephrology is consulted for assistance with management of AKI.  At the time of consult he was on 30 liters oxygen and has been weaned to 20 liters. Pulmonary/critical care has also been consulted.   Per primary team note he lives with his aunt James [name].  Both numbers for a James [name] are not in service for me.  He states that he has one adult child - his daughter.  He tries to think of his daughter's number and his sister's number and he cannot think of the numbers.  Though not fully able to consent he does say that he would want dialysis if needed.  Spoke with pulmonary - they favor bicarb gtt today and then dialysis tomorrow if no improvement.       Intake/Output Summary (Last 24 hours) at 06/12/2022 1522 Last data filed at 06/12/2022 1500 Gross per 24 hour  Intake 2697.02 ml  Output 697 ml  Net 2000.02 ml    Vitals:  Vitals:   06/12/22 1315 06/12/22 1330 06/12/22 1345 06/12/22 1400  BP: 90/70 99/72 106/75 107/72  Pulse: (!) 115 100 96 (!) 104  Resp: (!) 26 (!) 28 (!) 26 (!) 31  Temp:      TempSrc:      SpO2: 92% 99% 99% 96%  Weight:      Height:         Physical Exam:  General adult male in bed short of breath with exertion  HEENT normocephalic atraumatic extraocular movements intact sclera anicteric Neck supple trachea midline Lungs crackles anteriorly; tachypneic; on 6 liters oxygen  Heart S1s2 no rub tachycardic Abdomen soft slightly tender dist Extremities  no pitting edema lower extremities  Psych no anxiety or agitation  Neuro more readily answers questions today and recognizes family Access RIJ nontunneled catheter in place    Medications reviewed   Labs:     Latest Ref Rng & Units 06/12/2022    3:04 AM 06/11/2022    9:02 AM 06/10/2022    1:53 PM  BMP  Glucose 70 - 99 mg/dL 152  122  130   BUN 6 - 20 mg/dL 137  123  94   Creatinine 0.61 - 1.24 mg/dL 9.26  8.84  7.74   Sodium 135 - 145 mmol/L 137  137  135   Potassium 3.5 - 5.1 mmol/L 4.2  4.8  4.2   Chloride 98 - 111 mmol/L 98  100  99   CO2 22 - 32 mmol/L 21  16  18    Calcium 8.9 - 10.3 mg/dL 7.5  8.0  8.4      Assessment/Plan:    # Severe multifocal pneumonia - antibiotics per critical care    #  AKI  - May be secondary to prolonged pre-renal insults leading to ATN.  Setting of alcohol abuse and severe pneumonia.  Hasn't improved thus far with fluids from the ER.  Normotensive here.  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  No known recent baseline creatinine. Anuric.  ANA negative.  - Continue CRRT (started 06/12/22) - UF goal is keep even to net neg 50 ml/hr  - ANCA and complement pending  - send anti-GBM  # Strep pneumoniae bacteremia  - antibiotics per primary team.     # Acute hypoxic respiratory failure  - requiring high dose supplemental oxygen  - pulmonology is monitoring closely in the ICU   # Metabolic acidosis - now on CRRT    # Abdominal pain and vomiting  - per primary team  - perhaps has led to chronic pre-renal insults as above    # AMS - multifactorial setting of acidosis and AKI, alcohol abuse    # Transaminitis  - Setting of alcohol abuse    # Alcohol abuse  - Monitor for withdrawal per primary team    # hyperphosphatemia  - Secondary to AKI - with resp distress not really taking PO - starting CRRT    Disposition - continue ICU monitoring     Claudia Desanctis, MD 06/12/2022 3:47 PM

## 2022-06-12 NOTE — Plan of Care (Signed)
Spoke with daughter via phone.  She and I discussed the risks/benefits/indications for renal replacement therapy and she does consent to renal replacement therapy   Primary team has gotten nontunneled catheter  Orders placed for CRRT - goal keep even to net neg 50 ml/hr Stop bicarb gtt   Spoke with pulm   Appreciate critical care and nursing assistance   Claudia Desanctis, MD 8:57 AM 06/12/2022

## 2022-06-12 NOTE — Progress Notes (Addendum)
1600: SVT noted on monitor, rate 185-190. EKG completed and reads SVT. Mannam MD notified and orders placed for Amiodarone bolus and drip. BP 110/49 while on phone with MD. CRRT removal rate changed to 0.   1620: Amiodarone bolus completed with little change in heart rate. BP also began to decrease to 87/69. Mannam MD at bedside and verbal orders received for 6 mg Adenosine IV. No change in rhythm noted. 12 mg adenosine administered and heart rate converted to ST 115. BP 94/66. Persistent hypoxia after event, SpO2 staying 77-81% on 15L w NRB. Heated high flow placed back on patient w verbal order from MD by RT.  40L 100% FiO2 SpO2 97%.

## 2022-06-12 NOTE — Progress Notes (Signed)
NAME:  James [name], MRN:  [id number], DOB:  1970-10-18, LOS: 1 ADMISSION DATE:  06/10/2022, CONSULTATION DATE: 1/4 REFERRING MD: Dr. Raelyn Mora, CHIEF COMPLAINT: Nausea, vomiting  History of Present Illness:  52 year old male who presented to [facility] on 1/3 with reports of upper abdominal pain, nausea and vomiting.  Patient reports he developed abdominal pain for approximately 1 week prior to presentation.  In addition he had nausea and vomiting and subsequently developed shortness of breath.  He is a poor historian with known EtOH abuse.  He reported on presentation prior history of pancreatitis and suspected that this was possibly the same.  In the emergency room he was found to be hypertensive, tachycardic and tachypneic.  Initially on room air he was maintaining saturations but subsequently required heated high flow oxygen.  Initial labs-NA 137, K4.8, CL 100, CO2 16, glucose 122, BUN 123, creatinine 8.84, calcium 8, anion gap greater than 20, phosphorus 6.4, magnesium 2.6, albumin 2.5, AST 104, ALT 21, total bilirubin 1.8, , lipase 30, lactic acid 2.9 and cleared to 1.5 with IV fluid, PCT greater than 150, WBC 3.1, Hgb 11.8, and platelets 119.  Chest x-ray showed right middle lobe and right lower lobe infiltrate.  Subsequent CT imaging of chest abdomen pelvis noted extensive multifocal consolidation throughout the lung bases, no evidence of pancreatitis, and colon fluid-filled to the rectum suggestive of diarrheal illness.  Additional findings of prostamegaly noted.  CT head negative for acute intracranial abnormality.  However did note cerebral atrophy.  Patient was admitted for further evaluation of pneumonia and AKI.  Nephrology was consulted.  He was treated with broad-spectrum antibiotics to include Zyvox, cefepime and Flagyl. COVID, Flu, RSV screening negative.   PCCM consulted for evaluation.  He denies pain, SOB, N/V currently.  Reports if he could not make decisions for  himself, he would want his sister to make them.  States he drinks daily and occasionally gets "the shakes" if he does not drink.   Pertinent  Medical History  Alcohol abuse Pancreatitis Tobacco abuse  Significant Hospital Events: Including procedures, antibiotic start and stop dates in addition to other pertinent events   1/4 Admit with abd pain, N/V, PNA, AKI  1/5 Worsening renal failure, CRRT started  Interim History / Subjective:   Has worsening renal failure today, looking harder to breathe Mildly confused  Objective   Blood pressure (!) 129/101, pulse (!) 109, temperature 97.9 F (36.6 C), temperature source Oral, resp. rate (!) 27, height 5\' 8"  (1.727 m), weight 55.7 kg, SpO2 96 %.    FiO2 (%):  [30 %] 30 %   Intake/Output Summary (Last 24 hours) at 06/12/2022 0903 Last data filed at 06/12/2022 0700 Gross per 24 hour  Intake 3084.54 ml  Output 75 ml  Net 3009.54 ml   Filed Weights   06/11/22 1008  Weight: 55.7 kg    Examination: Gen:      No acute distress, thin, chronically ill-appearing HEENT:  EOMI, sclera anicteric Neck:     No masses; no thyromegaly Lungs:    Clear to auscultation bilaterally; normal respiratory effort CV:         Regular rate and rhythm; no murmurs Abd:      + bowel sounds; soft, non-tender; no palpable masses, no distension Ext:    No edema; adequate peripheral perfusion Skin:      Warm and dry; no rash Neuro: Awake, confused.  No focal abnormalities  Lab/imaging reviewed Significant for BUN/creatinine 137/9.26 AST  108 Platelets 95 Chest x-ray with bibasilar consolidation  Resolved Hospital Problem list     Assessment & Plan:   Abdominal Pain, Nausea & Vomiting, Diarrhea  Hepatic Steatosis  Intermittent vomiting / poor intake for 1week to 1 month. Lipase negative, no evidence of pancreatitis on CT.  -CT abd / pelvis noted  Continue Zofran as needed for nausea  Sepsis with N/V, Multifocal pneumococcus PNA present on admission IV  fluids Ceftriaxone, Flagyl Follow cultures, PCT  Multifocal PNA Acute Hypoxic Respiratory Failure secondary to PNA Suspected aspiration in setting of N/V Continue ceftriaxone, Flagyl Follow intermittent chest x-ray Aspiration precautions  AKI  Anion Gap Metabolic Acidosis Worsening renal failure and will need initiation of CRRT today Discussed with nephrology HD catheter placed  ETOH Abuse Elevated AST At Risk Withdrawal  CIWA protocol Folic acid, multivitamin, thiamine  Tobacco Abuse  Nicotine patch   Best Practice (right click and "Reselect all SmartList Selections" daily)   Per [facility]   Critical care time:    The patient is critically ill with multiple organ system failure and requires high complexity decision making for assessment and support, frequent evaluation and titration of therapies, advanced monitoring, review of radiographic studies and interpretation of complex data.   Critical Care Time devoted to patient care services, exclusive of separately billable procedures, described in this note is 35 minutes.   Marshell Garfinkel MD [facility] See Amion for pager  If no response to pager , please call [phone] until 7pm After 7:00 pm call Elink  [phone] 06/12/2022, 9:09 AM

## 2022-06-12 NOTE — Progress Notes (Signed)
RT NOTE:  Pt taken off [place] and placed on [place] HFNC 10L. Pt tolerating this well with saturations of 98-99%. RT will continue to monitor.

## 2022-06-12 NOTE — Progress Notes (Signed)
Madalyn Rob, MD is aware of decrease urine with patient. No new orders at this time. Will continue to monitor.

## 2022-06-12 NOTE — Care Management (Signed)
Patient seen and examined.  Overnight events noted.  I discussed case with critical care team.  Patient now with very complex critical care needs and is starting on CRRT.  PCCM will stay as primary.  TR[facility] will sign off.  Will accept patient when ready to get out of the intensive care unit.   No charge visit.

## 2022-06-12 NOTE — Progress Notes (Signed)
RT NOTE:  Pt had to be placed back on HHFNC due to increased O2 demand. CCMD aware.

## 2022-06-13 ENCOUNTER — Inpatient Hospital Stay (HOSPITAL_COMMUNITY): Payer: BLUE CROSS/BLUE SHIELD

## 2022-06-13 DIAGNOSIS — J9601 Acute respiratory failure with hypoxia: Secondary | ICD-10-CM | POA: Diagnosis not present

## 2022-06-13 LAB — RENAL FUNCTION PANEL
Albumin: 1.9 g/dL — ABNORMAL LOW (ref 3.5–5.0)
Albumin: 2.2 g/dL — ABNORMAL LOW (ref 3.5–5.0)
Anion gap: 11 (ref 5–15)
Anion gap: 12 (ref 5–15)
BUN: 57 mg/dL — ABNORMAL HIGH (ref 6–20)
BUN: 35 mg/dL — ABNORMAL HIGH (ref 6–20)
CO2: 26 mmol/L (ref 22–32)
CO2: 26 mmol/L (ref 22–32)
Calcium: 7.9 mg/dL — ABNORMAL LOW (ref 8.9–10.3)
Calcium: 8.2 mg/dL — ABNORMAL LOW (ref 8.9–10.3)
Chloride: 102 mmol/L (ref 98–111)
Chloride: 100 mmol/L (ref 98–111)
Creatinine, Ser: 3.89 mg/dL — ABNORMAL HIGH (ref 0.61–1.24)
Creatinine, Ser: 2.58 mg/dL — ABNORMAL HIGH (ref 0.61–1.24)
GFR, Estimated: 18 mL/min — ABNORMAL LOW (ref >60)
GFR, Estimated: 29 mL/min — ABNORMAL LOW (ref >60)
Glucose, Bld: 118 mg/dL — ABNORMAL HIGH (ref 70–99)
Glucose, Bld: 120 mg/dL — ABNORMAL HIGH (ref 70–99)
Phosphorus: 2.7 mg/dL (ref 2.5–4.6)
Phosphorus: 3 mg/dL (ref 2.5–4.6)
Potassium: 4.3 mmol/L (ref 3.5–5.1)
Potassium: 4.4 mmol/L (ref 3.5–5.1)
Sodium: 139 mmol/L (ref 135–145)
Sodium: 138 mmol/L (ref 135–145)

## 2022-06-13 LAB — CBC
HCT: 31.9 % — ABNORMAL LOW (ref 39.0–52.0)
Hemoglobin: 11.2 g/dL — ABNORMAL LOW (ref 13.0–17.0)
MCH: 33.6 pg (ref 26.0–34.0)
MCHC: 35.1 g/dL (ref 30.0–36.0)
MCV: 95.8 fL (ref 80.0–100.0)
Platelets: 81 10*3/uL — ABNORMAL LOW (ref 150–400)
RBC: 3.33 MIL/uL — ABNORMAL LOW (ref 4.22–5.81)
RDW: 14.8 % (ref 11.5–15.5)
WBC: 9.1 10*3/uL (ref 4.0–10.5)
nRBC: 0 % (ref 0.0–0.2)

## 2022-06-13 LAB — PROCALCITONIN: Procalcitonin: >150 ng/mL

## 2022-06-13 LAB — COMPREHENSIVE METABOLIC PANEL
ALT: 20 U/L (ref 0–44)
AST: 66 U/L — ABNORMAL HIGH (ref 15–41)
Albumin: 2 g/dL — ABNORMAL LOW (ref 3.5–5.0)
Alkaline Phosphatase: 28 U/L — ABNORMAL LOW (ref 38–126)
Anion gap: 10 (ref 5–15)
BUN: 58 mg/dL — ABNORMAL HIGH (ref 6–20)
CO2: 26 mmol/L (ref 22–32)
Calcium: 7.8 mg/dL — ABNORMAL LOW (ref 8.9–10.3)
Chloride: 101 mmol/L (ref 98–111)
Creatinine, Ser: 3.78 mg/dL — ABNORMAL HIGH (ref 0.61–1.24)
GFR, Estimated: 18 mL/min — ABNORMAL LOW (ref >60)
Glucose, Bld: 116 mg/dL — ABNORMAL HIGH (ref 70–99)
Potassium: 4.3 mmol/L (ref 3.5–5.1)
Sodium: 137 mmol/L (ref 135–145)
Total Bilirubin: 0.9 mg/dL (ref 0.3–1.2)
Total Protein: 5.7 g/dL — ABNORMAL LOW (ref 6.5–8.1)

## 2022-06-13 LAB — CULTURE, BLOOD (ROUTINE X 2): Special Requests: ADEQUATE

## 2022-06-13 LAB — MAGNESIUM: Magnesium: 2.5 mg/dL — ABNORMAL HIGH (ref 1.7–2.4)

## 2022-06-13 MED ORDER — SODIUM CHLORIDE 0.9 % IV SOLN
2.0000 g | Freq: Three times a day (TID) | INTRAVENOUS | Status: DC
  Administered 2022-06-13 – 2022-06-15 (×5): 2 g via INTRAVENOUS
  Filled 2022-06-13 (×6): qty 2000

## 2022-06-13 MED ORDER — METOCLOPRAMIDE HCL 5 MG/ML IJ SOLN
5.0000 mg | Freq: Once | INTRAMUSCULAR | Status: AC
  Administered 2022-06-13: 5 mg via INTRAVENOUS
  Filled 2022-06-13: qty 2

## 2022-06-13 MED ORDER — LORAZEPAM 2 MG/ML IJ SOLN
1.0000 mg | INTRAMUSCULAR | Status: DC | PRN
  Administered 2022-06-15: 1 mg via INTRAVENOUS
  Administered 2022-06-15 – 2022-06-17 (×7): 2 mg via INTRAVENOUS
  Filled 2022-06-13 (×8): qty 1

## 2022-06-13 MED ORDER — AMIODARONE LOAD VIA INFUSION
150.0000 mg | Freq: Once | INTRAVENOUS | Status: AC
  Administered 2022-06-13: 150 mg via INTRAVENOUS
  Filled 2022-06-13: qty 83.34

## 2022-06-13 MED ORDER — AMIODARONE IV BOLUS ONLY 150 MG/100ML: 150.0000 mg | Freq: Once | INTRAVENOUS | Status: DC

## 2022-06-13 NOTE — Progress Notes (Signed)
Attempted abg x 2 attempts were unsucessful. RN and md aware

## 2022-06-13 NOTE — Progress Notes (Signed)
[facility] Progress Note  Name: James BUCHAN MRN: [id number] DOB: [birth year]  Chief Complaint:  Abdominal pain, n/v  Assessment/Plan:    # Severe multifocal pneumonia - antibiotics per critical care    # AKI  - May be secondary to prolonged pre-renal insults leading to ATN.  Setting of alcohol abuse and severe pneumonia.  Hasn't improved thus far with fluids from the ER.  Normotensive here.  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  No known recent baseline creatinine. Anuric.  ANA negative.  - Continue CRRT (started 06/12/22) -> no e/o recovery and really won't know till CRRT is stopped but status still tenuous and would continue CRRT for now. K is within acceptable limits. - UF goal is keep even to net 0-50 ml/hr  - ANCA neg and complement nl - send anti-GBM  # Strep pneumoniae bacteremia  - antibiotics per primary team.     # Acute hypoxic respiratory failure  - requiring high dose supplemental oxygen  - pulmonology is monitoring closely in the ICU   # Metabolic acidosis - now on CRRT    # Abdominal pain and vomiting  - per primary team  - perhaps has led to chronic pre-renal insults as above    # AMS - multifactorial setting of acidosis and AKI, alcohol abuse    # Transaminitis  - Setting of alcohol abuse    # Alcohol abuse  - Monitor for withdrawal per primary team    # hyperphosphatemia  - Secondary to AKI - with resp distress not really taking PO - starting CRRT   Subjective:  Spoke with daughter who was bedside. Primary team placed nontunneled catheter and he was started on CRRT 1/5.  Tolerating per nursing.  Bicarb gtt was stopped.  He has been keep even to net neg 0-50 ml/hr. No UOP over the past 24hrs. Spoke with his sister and niece who were bedside.  Currently off pressors.  Review of systems:  Patient with shortness of breath  Ams improving  Taking sips of water with nursing supervision   ---------- Background on consult:   James [name] is a 52 y.o. male with a history of alcohol abuse, pancreatitis, and tobacco abuse who presented to the hospital with a one week duration of abdominal pain which he had attributed to pancreatitis.  He states that he started drinking about 5 months ago after a falling out with his mother around the time that he lost a job.  He states hasn't been eating or drinking much.  Denies NSAID use and states he doesn't take any meds or other substances however is altered.  He has no recent labs for comparison but presented with a creatinine of 7.74 compared with a prior lab of 0.89 in 2011.  CT abdomen/pelvis without contrast demonstrated extensive airspace opacities through the lung bases which was concerning for multifocal pneumonia and concern for aspiration.  He was noted to have prostatomegaly and no hydro.  He had a foley placed with minimal urine output (less than 10 ml per RN).  He received approximately 1.75 L of LR in the ER and then limited duration continuous LR after that.  Nephrology is consulted for assistance with management of AKI.  At the time of consult he was on 30 liters oxygen and has been weaned to 20 liters. Pulmonary/critical care has also been consulted.  Per primary team note he lives with his aunt James [name].  Both numbers for a [place]ver[place] Rising are not in service  for me.  He states that he has one adult child - his daughter.  He tries to think of his daughter's number and his sister's number and he cannot think of the numbers.  Though not fully able to consent he does say that he would want dialysis if needed.  Spoke with pulmonary - they favor bicarb gtt today and then dialysis tomorrow if no improvement.       Intake/Output Summary (Last 24 hours) at 06/12/2022 1522 Last data filed at 06/12/2022 1500 Gross per 24 hour  Intake 2697.02 ml  Output 697 ml  Net 2000.02 ml    Vitals:  Vitals:   06/12/22 1315 06/12/22 1330 06/12/22 1345 06/12/22 1400  BP: 90/70 99/72 106/75  107/72  Pulse: (!) 115 100 96 (!) 104  Resp: (!) 26 (!) 28 (!) 26 (!) 31  Temp:      TempSrc:      SpO2: 92% 99% 99% 96%  Weight:      Height:         Physical Exam:  General adult male in bed short of breath with exertion  HEENT normocephalic atraumatic extraocular movements intact sclera anicteric Neck supple trachea midline Lungs crackles anteriorly; tachypneic; on 6 liters oxygen  Heart S1s2 no rub tachycardic Abdomen soft slightly tender dist Extremities no pitting edema lower extremities  Psych no anxiety or agitation  Neuro more readily answers questions today and recognizes family Access RIJ nontunneled catheter in place    Medications reviewed   Labs:     Latest Ref Rng & Units 06/12/2022    3:04 AM 06/11/2022    9:02 AM 06/10/2022    1:53 PM  BMP  Glucose 70 - 99 mg/dL 152  122  130   BUN 6 - 20 mg/dL 137  123  94   Creatinine 0.61 - 1.24 mg/dL 9.26  8.84  7.74   Sodium 135 - 145 mmol/L 137  137  135   Potassium 3.5 - 5.1 mmol/L 4.2  4.8  4.2   Chloride 98 - 111 mmol/L 98  100  99   CO2 22 - 32 mmol/L 21  16  18    Calcium 8.9 - 10.3 mg/dL 7.5  8.0  8.4

## 2022-06-13 NOTE — Progress Notes (Signed)
[facility] Progress Note Patient Name: James [name] DOB: [birth year] MRN: [id number]   Date of Service  06/13/2022  HPI/Events of Note  Patient has CIWA score of 9, his Ativan order is IV. Pt now NPO, pulled out NGT. Also has Librium ordered  eICU Interventions  Will shift Ativan to IV per CIWA protocol for now until GI access can be resumed     Intervention Category Intermediate Interventions: Other:  Judd Lien 06/13/2022, 9:39 PM

## 2022-06-13 NOTE — Progress Notes (Signed)
[facility]Link Physician-Brief Progress Note Patient Name: James [name] DOB: April 28, 1971 MRN: [id number]   Date of Service  06/13/2022  HPI/Events of Note  Hiccups - Patient is already on Protonix. Creatinine = 6.74. Will avoid Baclofen.  eICU Interventions  Plan: Reglan 5 mg IV X 1.      Intervention Category Major Interventions: Other:  Loise Esguerra Cornelia Copa 06/13/2022, 2:04 AM

## 2022-06-13 NOTE — Progress Notes (Signed)
Pt found off of [place] and on 5L [place] doing well. No resp distress noted. [place] remained bedside.

## 2022-06-13 NOTE — Progress Notes (Signed)
NAME:  James [name], MRN:  [id number], DOB:  1971-02-28, LOS: 2 ADMISSION DATE:  06/10/2022, CONSULTATION DATE: 1/4 REFERRING MD: Dr. Corrie Mckusick, CHIEF COMPLAINT: Nausea, vomiting  History of Present Illness:  52 year old male who presented to [facility] on 1/3 with reports of upper abdominal pain, nausea and vomiting.  Patient reports he developed abdominal pain for approximately 1 week prior to presentation.  In addition he had nausea and vomiting and subsequently developed shortness of breath.  He is a poor historian with known EtOH abuse.  He reported on presentation prior history of pancreatitis and suspected that this was possibly the same.  In the emergency room he was found to be hypertensive, tachycardic and tachypneic.  Initially on room air he was maintaining saturations but subsequently required heated high flow oxygen.  Initial labs-NA 137, K4.8, CL 100, CO2 16, glucose 122, BUN 123, creatinine 8.84, calcium 8, anion gap greater than 20, phosphorus 6.4, magnesium 2.6, albumin 2.5, AST 104, ALT 21, total bilirubin 1.8, , lipase 30, lactic acid 2.9 and cleared to 1.5 with IV fluid, PCT greater than 150, WBC 3.1, Hgb 11.8, and platelets 119.  Chest x-ray showed right middle lobe and right lower lobe infiltrate.  Subsequent CT imaging of chest abdomen pelvis noted extensive multifocal consolidation throughout the lung bases, no evidence of pancreatitis, and colon fluid-filled to the rectum suggestive of diarrheal illness.  Additional findings of prostamegaly noted.  CT head negative for acute intracranial abnormality.  However did note cerebral atrophy.  Patient was admitted for further evaluation of pneumonia and AKI.  Nephrology was consulted.  He was treated with broad-spectrum antibiotics to include Zyvox, cefepime and Flagyl. COVID, Flu, RSV screening negative.   PCCM consulted for evaluation.  He denies pain, SOB, N/V currently.  Reports if he could not make decisions for  himself, he would want his sister to make them.  States he drinks daily and occasionally gets "the shakes" if he does not drink.   Pertinent  Medical History  Alcohol abuse Pancreatitis Tobacco abuse  Significant Hospital Events: Including procedures, antibiotic start and stop dates in addition to other pertinent events   1/4 Admit with abd pain, N/V, PNA, AKI  1/5 Worsening renal failure, CRRT started  Interim History / Subjective:   Started on CRRT for worsening renal failure Continues on high flow nasal cannula Episode of SVT requiring adenosine x 2 and amiodarone.  Now in normal sinus rhythm with  Objective   Blood pressure 110/78, pulse 85, temperature 99.3 F (37.4 C), temperature source Axillary, resp. rate (!) 25, height 5\' 8"  (1.727 m), weight 58.8 kg, SpO2 100 %.    FiO2 (%):  [60 %-100 %] 60 %   Intake/Output Summary (Last 24 hours) at 06/13/2022 0818 Last data filed at 06/13/2022 0800 Gross per 24 hour  Intake 1864.3 ml  Output 2380 ml  Net -515.7 ml   Filed Weights   06/11/22 1008 06/12/22 0923 06/13/22 0500  Weight: 55.7 kg 55 kg 58.8 kg    Examination: Blood pressure 110/78, pulse 85, temperature 100.3 F (37.9 C), temperature source Axillary, resp. rate (!) 25, height 5\' 8"  (1.727 m), weight 58.8 kg, SpO2 100 %. Gen:      No acute distress, frail, chronically ill appearing HEENT:  EOMI, sclera anicteric Neck:     No masses; no thyromegaly Lungs:    Clear to auscultation bilaterally; normal respiratory effort CV:         Regular rate and  rhythm; no murmurs Abd:      + bowel sounds; soft, non-tender; no palpable masses, no distension Ext:    No edema; adequate peripheral perfusion Skin:      Warm and dry; no rash Neuro: Sedated  Labs/imaging reviewed Significant for BUN/creatinine 57/3.89 Hemoglobin 11.2, platelets 81 Chest x-ray with bibasilar opacities  Resolved Hospital Problem list     Assessment & Plan:   Abdominal Pain, Nausea & Vomiting,  Diarrhea  Hepatic Steatosis  Intermittent vomiting / poor intake for 1week to 1 month. Lipase negative, no evidence of pancreatitis on CT.  CT abd / pelvis noted  Continue Zofran as needed for nausea  Sepsis with N/V, Multifocal pneumococcus PNA present on admission Ceftriaxone, Flagyl Follow cultures, PCT  Multifocal PNA Acute Hypoxic Respiratory Failure secondary to PNA Suspected aspiration in setting of N/V Continue ceftriaxone, Flagyl Follow intermittent chest x-ray Aspiration precautions  AKI  Anion Gap Metabolic Acidosis Started on CRRT 1/5.  Continue gentle volume removal  ETOH Abuse Elevated AST At Risk Withdrawal  CIWA protocol Folic acid, multivitamin, thiamine  Tobacco Abuse  Nicotine patch   Best Practice (right click and "Reselect all SmartList Selections" daily)   Diet/type: NPO DVT prophylaxis: prophylactic heparin  GI prophylaxis: PPI Lines: Dialysis Catheter Foley:  Yes, and it is still needed Code Status:  full code Last date of multidisciplinary goals of care discussion []   Critical care time:    The patient is critically ill with multiple organ system failure and requires high complexity decision making for assessment and support, frequent evaluation and titration of therapies, advanced monitoring, review of radiographic studies and interpretation of complex data.   Critical Care Time devoted to patient care services, exclusive of separately billable procedures, described in this note is 35 minutes.   Marshell Garfinkel MD [facility] See Amion for pager  If no response to pager , please call [phone] until 7pm After 7:00 pm call Elink  [phone] 06/13/2022, 8:18 AM

## 2022-06-14 DIAGNOSIS — J9601 Acute respiratory failure with hypoxia: Secondary | ICD-10-CM | POA: Diagnosis not present

## 2022-06-14 LAB — BASIC METABOLIC PANEL
Anion gap: 8 (ref 5–15)
BUN: 31 mg/dL — ABNORMAL HIGH (ref 6–20)
CO2: 26 mmol/L (ref 22–32)
Calcium: 8 mg/dL — ABNORMAL LOW (ref 8.9–10.3)
Chloride: 104 mmol/L (ref 98–111)
Creatinine, Ser: 2.16 mg/dL — ABNORMAL HIGH (ref 0.61–1.24)
GFR, Estimated: 36 mL/min — ABNORMAL LOW (ref >60)
Glucose, Bld: 101 mg/dL — ABNORMAL HIGH (ref 70–99)
Potassium: 4.3 mmol/L (ref 3.5–5.1)
Sodium: 138 mmol/L (ref 135–145)

## 2022-06-14 LAB — MAGNESIUM: Magnesium: 2.6 mg/dL — ABNORMAL HIGH (ref 1.7–2.4)

## 2022-06-14 LAB — RENAL FUNCTION PANEL
Albumin: 2 g/dL — ABNORMAL LOW (ref 3.5–5.0)
Anion gap: 8 (ref 5–15)
BUN: 24 mg/dL — ABNORMAL HIGH (ref 6–20)
CO2: 25 mmol/L (ref 22–32)
Calcium: 7.8 mg/dL — ABNORMAL LOW (ref 8.9–10.3)
Chloride: 103 mmol/L (ref 98–111)
Creatinine, Ser: 1.82 mg/dL — ABNORMAL HIGH (ref 0.61–1.24)
GFR, Estimated: 44 mL/min — ABNORMAL LOW (ref >60)
Glucose, Bld: 111 mg/dL — ABNORMAL HIGH (ref 70–99)
Phosphorus: 2 mg/dL — ABNORMAL LOW (ref 2.5–4.6)
Potassium: 3.9 mmol/L (ref 3.5–5.1)
Sodium: 136 mmol/L (ref 135–145)

## 2022-06-14 LAB — CBC
HCT: 31.1 % — ABNORMAL LOW (ref 39.0–52.0)
Hemoglobin: 11 g/dL — ABNORMAL LOW (ref 13.0–17.0)
MCH: 34.1 pg — ABNORMAL HIGH (ref 26.0–34.0)
MCHC: 35.4 g/dL (ref 30.0–36.0)
MCV: 96.3 fL (ref 80.0–100.0)
Platelets: 83 10*3/uL — ABNORMAL LOW (ref 150–400)
RBC: 3.23 MIL/uL — ABNORMAL LOW (ref 4.22–5.81)
RDW: 14.9 % (ref 11.5–15.5)
WBC: 9.1 10*3/uL (ref 4.0–10.5)
nRBC: 0 % (ref 0.0–0.2)

## 2022-06-14 LAB — AMMONIA: Ammonia: 15 umol/L (ref 9–35)

## 2022-06-14 MED ORDER — CHLORHEXIDINE GLUCONATE CLOTH 2 % EX PADS
6.0000 | MEDICATED_PAD | Freq: Every day | CUTANEOUS | Status: DC
  Administered 2022-06-15 – 2022-06-24 (×6): 6 via TOPICAL

## 2022-06-14 MED ORDER — LACTULOSE 10 GM/15ML PO SOLN
20.0000 g | Freq: Every day | ORAL | Status: DC
  Administered 2022-06-14 – 2022-06-22 (×7): 20 g via ORAL
  Filled 2022-06-14 (×10): qty 30

## 2022-06-14 MED ORDER — DOCUSATE SODIUM 100 MG PO CAPS
100.0000 mg | ORAL_CAPSULE | Freq: Two times a day (BID) | ORAL | Status: DC
  Administered 2022-06-14 – 2022-06-24 (×13): 100 mg via ORAL
  Filled 2022-06-14 (×21): qty 1

## 2022-06-14 MED ORDER — POLYETHYLENE GLYCOL 3350 17 G PO PACK
17.0000 g | PACK | Freq: Every day | ORAL | Status: DC
  Administered 2022-06-14 – 2022-06-20 (×5): 17 g via ORAL
  Filled 2022-06-14 (×10): qty 1

## 2022-06-14 NOTE — Progress Notes (Signed)
[facility] Progress Note  Name: James [name] MRN: [id number] DOB: 1970-06-19  Chief Complaint:  Abdominal pain, n/v  Assessment/Plan:    # Severe multifocal pneumonia - antibiotics per critical care    # AKI  - May be secondary to prolonged pre-renal insults leading to ATN.  Setting of alcohol abuse and severe pneumonia.  Hasn't improved thus far with fluids from the ER.  Normotensive here.  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  No known recent baseline creatinine. Anuric.  ANA negative.  - Continue CRRT (started 06/12/22) -> no e/o recovery and really won't know till CRRT is stopped but status still tenuous and would continue CRRT for now. K is within acceptable limits. Filter not clotting. - Keep UF goal even to net 0-50 ml/hr; he doesn't appear to have that much volume onboard currently and BP dropping as well.  - ANCA neg and complement nl - sent anti-GBM collected on 1/5  # Strep pneumoniae bacteremia  - antibiotics per primary team.     # Acute hypoxic respiratory failure  - supplemental oxygen requirements improving (only on 2L [place] currently) - pulmonology is monitoring closely in the ICU   # Metabolic acidosis - now on CRRT    # Abdominal pain and vomiting  - per primary team  - perhaps has led to chronic pre-renal insults as above    # AMS - multifactorial setting of acidosis and AKI, alcohol abuse    # Transaminitis  - Setting of alcohol abuse    # Alcohol abuse  - Monitor for withdrawal per primary team    # hyperphosphatemia  - Secondary to AKI - with resp distress not really taking PO - starting CRRT   Subjective:  Primary team placed nontunneled catheter and he was started on CRRT 1/5.  Tolerating per nursing.  Bicarb gtt was stopped.  He has been keep even to net neg 0-50 ml/hr. No UOP over the past 24hrs. Off pressors.  Review of systems:  Patient with shortness of breath  Ams improving  Taking sips of water with nursing  supervision   ---------- Background on consult:  James [name] is a 52 y.o. male with a history of alcohol abuse, pancreatitis, and tobacco abuse who presented to the hospital with a one week duration of abdominal pain which he had attributed to pancreatitis.  He states that he started drinking about 5 months ago after a falling out with his mother around the time that he lost a job.  He states hasn't been eating or drinking much.  Denies NSAID use and states he doesn't take any meds or other substances however is altered.  He has no recent labs for comparison but presented with a creatinine of 7.74 compared with a prior lab of 0.89 in 2011.  CT abdomen/pelvis without contrast demonstrated extensive airspace opacities through the lung bases which was concerning for multifocal pneumonia and concern for aspiration.  He was noted to have prostatomegaly and no hydro.  He had a foley placed with minimal urine output (less than 10 ml per RN).  He received approximately 1.75 L of LR in the ER and then limited duration continuous LR after that.  Nephrology is consulted for assistance with management of AKI.  At the time of consult he was on 30 liters oxygen and has been weaned to 20 liters. Pulmonary/critical care has also been consulted.  Per primary team note he lives with his aunt Renda Rolls.  Both numbers for a  Elveria Rising are not in service for me.  He states that he has one adult child - his daughter.  He tries to think of his daughter's number and his sister's number and he cannot think of the numbers.  Though not fully able to consent he does say that he would want dialysis if needed.  Spoke with pulmonary - they favor bicarb gtt today and then dialysis tomorrow if no improvement.       Intake/Output Summary (Last 24 hours) at 06/12/2022 1522 Last data filed at 06/12/2022 1500 Gross per 24 hour  Intake 2697.02 ml  Output 697 ml  Net 2000.02 ml    Vitals:  Vitals:   06/12/22 1315 06/12/22 1330  06/12/22 1345 06/12/22 1400  BP: 90/70 99/72 106/75 107/72  Pulse: (!) 115 100 96 (!) 104  Resp: (!) 26 (!) 28 (!) 26 (!) 31  Temp:      TempSrc:      SpO2: 92% 99% 99% 96%  Weight:      Height:         Physical Exam:  General adult male in bed short of breath with exertion  HEENT NCAT Neck supple trachea midline Lungs breathing comfortably; on 2 liters [place] oxygen  Heart S1s2 no rub tachycardic Abdomen soft slightly tender dist Extremities no pitting edema lower extremities  Psych no anxiety or agitation  Neuro more readily answers questions today and recognizes family Access RIJ nontunneled catheter in place    Medications reviewed   Labs:     Latest Ref Rng & Units 06/12/2022    3:04 AM 06/11/2022    9:02 AM 06/10/2022    1:53 PM  BMP  Glucose 70 - 99 mg/dL 152  122  130   BUN 6 - 20 mg/dL 137  123  94   Creatinine 0.61 - 1.24 mg/dL 9.26  8.84  7.74   Sodium 135 - 145 mmol/L 137  137  135   Potassium 3.5 - 5.1 mmol/L 4.2  4.8  4.2   Chloride 98 - 111 mmol/L 98  100  99   CO2 22 - 32 mmol/L 21  16  18    Calcium 8.9 - 10.3 mg/dL 7.5  8.0  8.4

## 2022-06-14 NOTE — Progress Notes (Addendum)
NAME:  James [name], MRN:  [id number], DOB:  06/14/1970, LOS: 3 ADMISSION DATE:  06/10/2022, CONSULTATION DATE: 1/4 REFERRING MD: Dr. Raelyn Mora, CHIEF COMPLAINT: Nausea, vomiting  History of Present Illness:  52 year old male who presented to [facility] on 1/3 with reports of upper abdominal pain, nausea and vomiting.  Patient reports he developed abdominal pain for approximately 1 week prior to presentation.  In addition he had nausea and vomiting and subsequently developed shortness of breath.  He is a poor historian with known EtOH abuse.  He reported on presentation prior history of pancreatitis and suspected that this was possibly the same.  In the emergency room he was found to be hypertensive, tachycardic and tachypneic.  Initially on room air he was maintaining saturations but subsequently required heated high flow oxygen.  Initial labs-NA 137, K4.8, CL 100, CO2 16, glucose 122, BUN 123, creatinine 8.84, calcium 8, anion gap greater than 20, phosphorus 6.4, magnesium 2.6, albumin 2.5, AST 104, ALT 21, total bilirubin 1.8, , lipase 30, lactic acid 2.9 and cleared to 1.5 with IV fluid, PCT greater than 150, WBC 3.1, Hgb 11.8, and platelets 119.  Chest x-ray showed right middle lobe and right lower lobe infiltrate.  Subsequent CT imaging of chest abdomen pelvis noted extensive multifocal consolidation throughout the lung bases, no evidence of pancreatitis, and colon fluid-filled to the rectum suggestive of diarrheal illness.  Additional findings of prostamegaly noted.  CT head negative for acute intracranial abnormality.  However did note cerebral atrophy.  Patient was admitted for further evaluation of pneumonia and AKI.  Nephrology was consulted.  He was treated with broad-spectrum antibiotics to include Zyvox, cefepime and Flagyl. COVID, Flu, RSV screening negative.   PCCM consulted for evaluation.  He denies pain, SOB, N/V currently.  Reports if he could not make decisions for  himself, he would want his sister to make them.  States he drinks daily and occasionally gets "the shakes" if he does not drink.   Pertinent  Medical History  Alcohol abuse Pancreatitis Tobacco abuse  Significant Hospital Events: Including procedures, antibiotic start and stop dates in addition to other pertinent events   1/4 Admit with abd pain, N/V, PNA, AKI  1/5 Worsening renal failure, CRRT started.  Episode of SVT requiring adenosine x 2 and amiodarone.     Interim History / Subjective:   Has been in and out of A-fib.  Continues on amnio drip.  He required additional bolus of amiodarone yesterday evening Continues on CRRT  Objective   Blood pressure 110/72, pulse 77, temperature (!) 100.5 F (38.1 C), temperature source Axillary, resp. rate 20, height 5\' 8"  (1.727 m), weight 58.8 kg, SpO2 100 %.    FiO2 (%):  [40 %-60 %] 40 %   Intake/Output Summary (Last 24 hours) at 06/14/2022 0749 Last data filed at 06/14/2022 0740 Gross per 24 hour  Intake 1061.63 ml  Output 1951 ml  Net -889.37 ml   Filed Weights   06/11/22 1008 06/12/22 0923 06/13/22 0500  Weight: 55.7 kg 55 kg 58.8 kg    Examination: Gen:      No acute distress frail, chronically ill-appearing HEENT:  EOMI, sclera anicteric Neck:     No masses; no thyromegaly Lungs:    Clear to auscultation bilaterally; normal respiratory effort CV:         Regular rate and rhythm; no murmurs Abd:      + bowel sounds; soft, non-tender; no palpable masses, no distension Ext:  No edema; adequate peripheral perfusion Skin:      Warm and dry; no rash Neuro: Somnolent, arousable  Labs/imaging reviewed BUN/creatinine 31/2.16 Hemoglobin 11, platelets 83 Abdominal x-ray with gas-filled loops of bowel  Resolved Hospital Problem list     Assessment & Plan:   Abdominal Pain, Nausea & Vomiting, Diarrhea  Hepatic Steatosis  Intermittent vomiting / poor intake for 1week to 1 month. Lipase negative, no evidence of pancreatitis on  CT.  Continue Zofran as needed for nausea Chest x-ray reviewed with dilated bowel and no clear evidence of ileus.  Will start bowel regimen for constipation  Sepsis with N/V, Multifocal pneumococcus PNA present on admission Acute Hypoxic Respiratory Failure secondary to PNA Antibiotics narrowed to ampicillin on 1/6 Weaning down oxygen as tolerated Aspiration precautions  Paroxysmal SVT, atrial fibrillation Continue amiodarone drip  AKI  Anion Gap Metabolic Acidosis Started on CRRT 1/5.  Continue gentle volume removal  ETOH Abuse Elevated AST At Risk Withdrawal  CIWA protocol Folic acid, multivitamin, thiamine  Tobacco Abuse  Nicotine patch   Get OOB today and mobilize  Best Practice (right click and "Reselect all SmartList Selections" daily)   Diet/type: NPO DVT prophylaxis: prophylactic heparin  GI prophylaxis: PPI Lines: Dialysis Catheter Foley:  Yes, and it is still needed Code Status:  full code Last date of multidisciplinary goals of care discussion [] .  Daughter updated at bedside 1/6  Critical care time:    The patient is critically ill with multiple organ system failure and requires high complexity decision making for assessment and support, frequent evaluation and titration of therapies, advanced monitoring, review of radiographic studies and interpretation of complex data.   Critical Care Time devoted to patient care services, exclusive of separately billable procedures, described in this note is 35 minutes.   MD [facility] See Amion for pager  If no response to pager , please call [phone] until 7pm After 7:00 pm call Elink  [phone] 06/14/2022, 7:49 AM

## 2022-06-15 ENCOUNTER — Inpatient Hospital Stay (HOSPITAL_COMMUNITY): Payer: BLUE CROSS/BLUE SHIELD

## 2022-06-15 DIAGNOSIS — R7881 Bacteremia: Secondary | ICD-10-CM

## 2022-06-15 DIAGNOSIS — J9601 Acute respiratory failure with hypoxia: Secondary | ICD-10-CM | POA: Diagnosis not present

## 2022-06-15 DIAGNOSIS — M109 Gout, unspecified: Secondary | ICD-10-CM | POA: Diagnosis not present

## 2022-06-15 DIAGNOSIS — L8990 Pressure ulcer of unspecified site, unspecified stage: Secondary | ICD-10-CM | POA: Diagnosis not present

## 2022-06-15 LAB — ECHOCARDIOGRAM COMPLETE
AR max vel: 2.91 cm2
AV Area VTI: 2.93 cm2
AV Area mean vel: 2.69 cm2
AV Mean grad: 2 mmHg
AV Peak grad: 3.2 mmHg
Ao pk vel: 0.9 m/s
Area-P 1/2: 5.88 cm2
Height: 68 in
S' Lateral: 2.8 cm
Weight: 1961.21 oz

## 2022-06-15 LAB — BASIC METABOLIC PANEL
Anion gap: 9 (ref 5–15)
Anion gap: 10 (ref 5–15)
BUN: 21 mg/dL — ABNORMAL HIGH (ref 6–20)
BUN: 22 mg/dL — ABNORMAL HIGH (ref 6–20)
CO2: 24 mmol/L (ref 22–32)
CO2: 25 mmol/L (ref 22–32)
Calcium: 8.1 mg/dL — ABNORMAL LOW (ref 8.9–10.3)
Calcium: 7.7 mg/dL — ABNORMAL LOW (ref 8.9–10.3)
Chloride: 103 mmol/L (ref 98–111)
Chloride: 105 mmol/L (ref 98–111)
Creatinine, Ser: 1.77 mg/dL — ABNORMAL HIGH (ref 0.61–1.24)
Creatinine, Ser: 2.06 mg/dL — ABNORMAL HIGH (ref 0.61–1.24)
GFR, Estimated: 45 mL/min — ABNORMAL LOW (ref >60)
GFR, Estimated: 38 mL/min — ABNORMAL LOW (ref >60)
Glucose, Bld: 105 mg/dL — ABNORMAL HIGH (ref 70–99)
Glucose, Bld: 117 mg/dL — ABNORMAL HIGH (ref 70–99)
Potassium: 3.9 mmol/L (ref 3.5–5.1)
Potassium: 3.7 mmol/L (ref 3.5–5.1)
Sodium: 136 mmol/L (ref 135–145)
Sodium: 140 mmol/L (ref 135–145)

## 2022-06-15 LAB — CBC
HCT: 32.5 % — ABNORMAL LOW (ref 39.0–52.0)
Hemoglobin: 11.1 g/dL — ABNORMAL LOW (ref 13.0–17.0)
MCH: 33.3 pg (ref 26.0–34.0)
MCHC: 34.2 g/dL (ref 30.0–36.0)
MCV: 97.6 fL (ref 80.0–100.0)
Platelets: 100 10*3/uL — ABNORMAL LOW (ref 150–400)
RBC: 3.33 MIL/uL — ABNORMAL LOW (ref 4.22–5.81)
RDW: 15.5 % (ref 11.5–15.5)
WBC: 10.3 10*3/uL (ref 4.0–10.5)
nRBC: 0 % (ref 0.0–0.2)

## 2022-06-15 LAB — RENAL FUNCTION PANEL
Albumin: 2 g/dL — ABNORMAL LOW (ref 3.5–5.0)
Anion gap: 9 (ref 5–15)
BUN: 24 mg/dL — ABNORMAL HIGH (ref 6–20)
CO2: 24 mmol/L (ref 22–32)
Calcium: 8 mg/dL — ABNORMAL LOW (ref 8.9–10.3)
Chloride: 102 mmol/L (ref 98–111)
Creatinine, Ser: 1.89 mg/dL — ABNORMAL HIGH (ref 0.61–1.24)
GFR, Estimated: 42 mL/min — ABNORMAL LOW (ref >60)
Glucose, Bld: 107 mg/dL — ABNORMAL HIGH (ref 70–99)
Phosphorus: 1.8 mg/dL — ABNORMAL LOW (ref 2.5–4.6)
Potassium: 3.9 mmol/L (ref 3.5–5.1)
Sodium: 135 mmol/L (ref 135–145)

## 2022-06-15 LAB — BLOOD GAS, VENOUS
Acid-Base Excess: 6.2 mmol/L — ABNORMAL HIGH (ref 0.0–2.0)
Bicarbonate: 30.6 mmol/L — ABNORMAL HIGH (ref 20.0–28.0)
O2 Saturation: 29.5 %
Patient temperature: 37.4
pCO2, Ven: 43 mmHg — ABNORMAL LOW (ref 44–60)
pH, Ven: 7.46 — ABNORMAL HIGH (ref 7.25–7.43)
pO2, Ven: <31 mmHg — CL (ref 32–45)

## 2022-06-15 LAB — LEGIONELLA PNEUMOPHILA SEROGP 1 UR AG: L. pneumophila Serogp 1 Ur Ag: NEGATIVE

## 2022-06-15 LAB — GLUCOSE, CAPILLARY: Glucose-Capillary: 95 mg/dL (ref 70–99)

## 2022-06-15 LAB — MAGNESIUM: Magnesium: 2.8 mg/dL — ABNORMAL HIGH (ref 1.7–2.4)

## 2022-06-15 LAB — URIC ACID: Uric Acid, Serum: 1.8 mg/dL — ABNORMAL LOW (ref 3.7–8.6)

## 2022-06-15 LAB — GLOMERULAR BASEMENT MEMBRANE ANTIBODIES: GBM Ab: <0.2 U (ref 0.0–0.9)

## 2022-06-15 MED ORDER — HEPARIN SODIUM (PORCINE) 1000 UNIT/ML DIALYSIS
1000.0000 [IU] | Freq: Once | INTRAMUSCULAR | Status: AC
  Administered 2022-06-15: 2400 [IU] via INTRAVENOUS_CENTRAL
  Filled 2022-06-15: qty 3
  Filled 2022-06-15: qty 6

## 2022-06-15 MED ORDER — SODIUM CHLORIDE 0.9 % IV SOLN
2.0000 g | Freq: Four times a day (QID) | INTRAVENOUS | Status: DC
  Administered 2022-06-15 – 2022-06-16 (×3): 2 g via INTRAVENOUS
  Filled 2022-06-15 (×4): qty 2000

## 2022-06-15 MED ORDER — SODIUM PHOSPHATES 45 MMOLE/15ML IV SOLN
30.0000 mmol | Freq: Once | INTRAVENOUS | Status: AC
  Administered 2022-06-15: 30 mmol via INTRAVENOUS
  Filled 2022-06-15: qty 10

## 2022-06-15 MED ORDER — SODIUM CHLORIDE 0.9 % IV BOLUS
750.0000 mL | Freq: Once | INTRAVENOUS | Status: AC
  Administered 2022-06-15: 750 mL via INTRAVENOUS

## 2022-06-15 MED ORDER — SODIUM CHLORIDE 0.9 % IV SOLN: INTRAVENOUS | Status: DC

## 2022-06-15 NOTE — Progress Notes (Signed)
PHARMACY NOTE:  ANTIMICROBIAL RENAL DOSAGE ADJUSTMENT  Current antimicrobial regimen includes a mismatch between antimicrobial dosage and estimated renal function.  As per policy approved by the Pharmacy & Therapeutics and Medical Executive Committees, the antimicrobial dosage will be adjusted accordingly.  Current antimicrobial dosage:  ampicillin 2gm IV q8h  Indication: PNA and strep pneumo bacteremia  Renal Function:  Estimated Creatinine Clearance: 35.5 mL/min (A) (by C-G formula based on SCr of 1.89 mg/dL (H)). []      On intermittent HD, scheduled: []      On CRRT    Per Dr. Jonnie Finner, d/cing CRRT today (1/8)  Antimicrobial dosage has been changed to:  ampicillin 2gm q6h    Thank you for allowing pharmacy to be a part of this patient's care.  Lynelle Doctor, [facility] 06/15/2022 1:57 PM

## 2022-06-15 NOTE — TOC Progression Note (Signed)
Transition of Care [facility]) - Progression Note    Patient Details  Name: James [name] MRN: [id number] Date of Birth: 1971/06/08  Transition of Care [facility]) CM/SW [place], RN Phone Number: 06/15/2022, 3:00 PM  Clinical Narrative:   No additional TOC needs at this time, SA resources attached to AVS.  TOC will continue to monitor patient advancement through interdisciplinary progression rounds. If new patient transition needs arise, please place a TOC consult.     Expected Discharge Plan: Home/Self Care Barriers to Discharge: Continued Medical Work up  Expected Discharge Plan and Services       Living arrangements for the past 2 months: Single Family Home                                       Social Determinants of Health (SDOH) Interventions SDOH Screenings   Tobacco Use: High Risk (06/11/2022)    Readmission Risk Interventions     No data to display

## 2022-06-15 NOTE — Progress Notes (Signed)
NAME:  James [name], MRN:  [id number], DOB:  08/28/1970, LOS: 4 ADMISSION DATE:  06/10/2022, CONSULTATION DATE: 1/4 REFERRING MD: Dr. Corrie Mckusick, CHIEF COMPLAINT: Nausea, vomiting  History of Present Illness:  52 year old male who presented to [facility] on 1/3 with reports of upper abdominal pain, nausea and vomiting.  Patient reports he developed abdominal pain for approximately 1 week prior to presentation.  In addition he had nausea and vomiting and subsequently developed shortness of breath.  He is a poor historian with known EtOH abuse.  He reported on presentation prior history of pancreatitis and suspected that this was possibly the same.  In the emergency room he was found to be hypertensive, tachycardic and tachypneic.  Initially on room air he was maintaining saturations but subsequently required heated high flow oxygen.  Initial labs-NA 137, K4.8, CL 100, CO2 16, glucose 122, BUN 123, creatinine 8.84, calcium 8, anion gap greater than 20, phosphorus 6.4, magnesium 2.6, albumin 2.5, AST 104, ALT 21, total bilirubin 1.8, , lipase 30, lactic acid 2.9 and cleared to 1.5 with IV fluid, PCT greater than 150, WBC 3.1, Hgb 11.8, and platelets 119.  Chest x-ray showed right middle lobe and right lower lobe infiltrate.  Subsequent CT imaging of chest abdomen pelvis noted extensive multifocal consolidation throughout the lung bases, no evidence of pancreatitis, and colon fluid-filled to the rectum suggestive of diarrheal illness.  Additional findings of prostamegaly noted.  CT head negative for acute intracranial abnormality.  However did note cerebral atrophy.  Patient was admitted for further evaluation of pneumonia and AKI.  Nephrology was consulted.  He was treated with broad-spectrum antibiotics to include Zyvox, cefepime and Flagyl. COVID, Flu, RSV screening negative.   PCCM consulted for evaluation.  He denies pain, SOB, N/V currently.  Reports if he could not make decisions for  himself, he would want his sister to make them.  States he drinks daily and occasionally gets "the shakes" if he does not drink.   Pertinent  Medical History  Alcohol abuse Pancreatitis Tobacco abuse  Significant Hospital Events: Including procedures, antibiotic start and stop dates in addition to other pertinent events   1/4 Admit with abd pain, N/V, PNA, AKI  1/5 Worsening renal failure, CRRT started.  Episode of SVT requiring adenosine x 2 and amiodarone.     Interim History / Subjective:   Has been in and out of A-fib.  Continues on amnio drip.  He required additional bolus of amiodarone yesterday evening Continues on CRRT  Objective   Blood pressure 104/67, pulse 90, temperature 99.4 F (37.4 C), temperature source Axillary, resp. rate (!) 25, height 5\' 8"  (1.727 m), weight 58.8 kg, SpO2 97 %.        Intake/Output Summary (Last 24 hours) at 06/15/2022 0850 Last data filed at 06/15/2022 0825 Gross per 24 hour  Intake 1162.8 ml  Output 1805 ml  Net -642.2 ml   Filed Weights   06/11/22 1008 06/12/22 0923 06/13/22 0500  Weight: 55.7 kg 55 kg 58.8 kg    Examination: Gen:    Frail, chronically ill appearing HEENT:  EOMI, sclera anicteric Neck:     No masses; no thyromegaly Lungs:    Clear to auscultation bilaterally; tachypneic CV:         Regular rate and rhythm; no murmurs Abd:      + bowel sounds; distended but soft Ext:    No edema; adequate peripheral perfusion Neuro: drowsy but arousable easily, inattentive  Labs/imaging reviewed  Albumin 2 Ammonia 15 WBC 10    Resolved Hospital Problem list     Assessment & Plan:   Constipation Intermittent vomiting / poor intake for 1week to 1 month. Lipase negative, no evidence of pancreatitis on CT.  Bowel regimen  Sepsis with N/V, Multifocal pneumococcus PNA present on admission with strep pneumo bacteremia Acute Hypoxic Respiratory Failure secondary to PNA Antibiotics narrowed to ampicillin on 1/6- Weaning down  oxygen as tolerated Aspiration precautions Pulmonary hygiene Repeat CXR today in setting tachypnea Check repeat BCx, TTE given higher likelihood of s pneumo IE in pts with alcohol use  Acute metabolic encephalopathy Likely related to infection, possibility of s pneumo meningitis/encephalitis though at this point in ABX course LP would likely be challenging to interpret. CIWA as below PT/mobilize  R great toe pain, swelling MTP Gout vs septic arthritis XR foot, uric acid Appreciate foot/ankle eval  Paroxysmal SVT, atrial fibrillation Stop amio gtt  AKI  Anion Gap Metabolic Acidosis Nephro following on RRT  ETOH Abuse Elevated AST At Risk Withdrawal  CIWA protocol Folic acid, multivitamin, thiamine  Tobacco Abuse  Nicotine patch     Best Practice (right click and "Reselect all SmartList Selections" daily)   Diet/type: NPO DVT prophylaxis: prophylactic heparin  GI prophylaxis: PPI Lines: Dialysis Catheter Foley:  Yes, and it is still needed Code Status:  full code Last date of multidisciplinary goals of care discussion [] .  Daughter updated at bedside 1/6  Critical care time:    The patient is critically ill with multiple organ system failure and requires high complexity decision making for assessment and support, frequent evaluation and titration of therapies, advanced monitoring, review of radiographic studies and interpretation of complex data.   Critical Care Time devoted to patient care services, exclusive of separately billable procedures, described in this note is 36 minutes.   George for pager  If no response to pager , please call [phone] until 7pm After 7:00 pm call Elink  [phone] 06/15/2022, 8:50 AM

## 2022-06-15 NOTE — Progress Notes (Signed)
[facility] Progress Note  Subjective: no c/o today, remains off pressors  Vitals:   06/15/22 1500 06/15/22 1530 06/15/22 1550 06/15/22 1600  BP: (!) 127/98 117/83  112/79  Pulse: 92 90  87  Resp: (!) 30 (!) 38  (!) 30  Temp:   98 F (36.7 C)   TempSrc:   Axillary   SpO2: 94% 96%  96%  Weight:      Height:        Exam: General adult male in bed HEENT NCAT Neck supple trachea midline Lungs breathing comfortably; on 2 liters [place] oxygen  Heart S1s2 no rub tachycardic Abdomen soft slightly tender dist Extremities no edema lower extremities  Psych no anxiety or agitation  Neuro more readily answers questions today  Access RIJ nontunneled catheter in place    UA cloudy, large Hb, 100 prot, >50rbc, 21-50 wbc/ epis  UNa 69, UCr 101  Assessment/ Plan: AKI - may be secondary to prolonged pre-renal insults leading to ATN.  Setting of alcohol abuse and severe pneumonia.  Hasn't improved thus far with fluids from the ER.  Normotensive here.  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  ANCA neg and complement nl, sent anti-GBM collected on 1/5. No known recent baseline creatinine. Anuric.  ANA negative. CRRT was started 1/05. No signs of recovery but off pressors again today and may be a bit dry on exam. Will dc CRRT and give small bolus and IVF's at 100 cc/hr. Watch UOP, keep temp HD cath in. Can send to [facility] for iHD if needed.  Severe multifocal pneumonia/ +strep pneumoniae bacteremia - antibiotics per primary team.   Acute hypoxic respiratory failure - supplemental oxygen requirements improving (only on 2L [place] currently). CCM is monitoring closely in the ICU  Metabolic acidosis - resolved w/ CRRT  Abdominal pain and vomiting - per primary team  AMS - multifactorial setting of acidosis and AKI, alcohol abuse   Transaminitis - Setting of alcohol abuse  Alcohol abuse - monitor for withdrawal per primary team  Hyperphosphatemia - d/t AKI, better  James [name] 06/15/2022,  4:41 PM   Recent Labs  Lab 06/14/22 0315 06/14/22 1534 06/15/22 0244 06/15/22 0247 06/15/22 1543  HGB 11.0*  --  11.1*  --   --   ALBUMIN  --  2.0*  --  2.0*  --   CALCIUM 8.0* 7.8* 8.1* 8.0* 7.7*  PHOS  --  2.0*  --  1.8*  --   CREATININE 2.16* 1.82* 1.77* 1.89* 2.06*  K 4.3 3.9 3.9 3.9 3.7   No results for input(s): "IRON", "TIBC", "FERRITIN" in the last 168 hours. Inpatient medications:  Chlorhexidine Gluconate Cloth  6 each Topical Q2200   docusate sodium  100 mg Oral BID   folic acid  1 mg Intravenous Daily   heparin  5,000 Units Subcutaneous Q8H   lactulose  20 g Oral Daily   multivitamin with minerals  1 tablet Oral Daily   nicotine  21 mg Transdermal Daily   mouth rinse  15 mL Mouth Rinse 4 times per day   pantoprazole (PROTONIX) IV  40 mg Intravenous Q12H   polyethylene glycol  17 g Oral Daily    sodium chloride 100 mL/hr at 06/15/22 1600   ampicillin (OMNIPEN) IV Stopped (06/15/22 1542)   norepinephrine (LEVOPHED) Adult infusion Stopped (06/13/22 0604)   thiamine (VITAMIN B1) injection Stopped (06/15/22 1207)   acetaminophen **OR** acetaminophen, fentaNYL (SUBLIMAZE) injection, LORazepam, ondansetron **OR** ondansetron (ZOFRAN) IV, mouth rinse

## 2022-06-15 NOTE — Consult Note (Signed)
  Subjective:  Patient ID: James [name], male    DOB: Aug 19, 1970,  MRN: [id number]  Patient with past medical history of alcohol and tobbacco use as well as pancreatitis seen at beside today for concern of biolateral but mostly right foot pain .  Admitted for upper abdominal pain nausea and vomiting.  Noted yesterday to have some pain in his right great toe even to just having sheets on them. Some welling and redness noted as well. Podiatry called to evaluate. Patient does relates a possible history of gout.   Past Medical History:  Diagnosis Date   Alcohol abuse    Pancreatitis    Tobacco abuse      History reviewed. No pertinent surgical history.     Latest Ref Rng & Units 06/15/2022    2:44 AM 06/14/2022    3:15 AM 06/13/2022    2:45 AM  CBC  WBC 4.0 - 10.5 K/uL 10.3  9.1  9.1   Hemoglobin 13.0 - 17.0 g/dL 11.1  11.0  11.2   Hematocrit 39.0 - 52.0 % 32.5  31.1  31.9   Platelets 150 - 400 K/uL 100  83  81        Latest Ref Rng & Units 06/15/2022    2:47 AM 06/15/2022    2:44 AM 06/14/2022    3:34 PM  BMP  Glucose 70 - 99 mg/dL 107  105  111   BUN 6 - 20 mg/dL 24  21  24    Creatinine 0.61 - 1.24 mg/dL 1.89  1.77  1.82   Sodium 135 - 145 mmol/L 135  136  136   Potassium 3.5 - 5.1 mmol/L 3.9  3.9  3.9   Chloride 98 - 111 mmol/L 102  103  103   CO2 22 - 32 mmol/L 24  24  25    Calcium 8.9 - 10.3 mg/dL 8.0  8.1  7.8      Objective:   Vitals:   06/15/22 1130 06/15/22 1200  BP: 105/72 94/72  Pulse:  95  Resp: (!) 23 (!) 30  Temp:    SpO2:  100%    General:AA&O x 3. Normal mood and affect   Vascular: DP and PT pulses 2/4 bilateral. Brisk capillary refill to all digits. Pedal hair present   Neruological. Epicritic sensation grossly intact.   Derm: Mild erythema and edema noted around first MPJ bilateral more so on the right. No open wounds noted.   MSK: MMT 5/5 in dorsiflexion, plantar flexion, inversion and eversion. Normal joint ROM without pain or crepitus. Tender  to palpation of the first MPJ.     Assessment & Plan:  Patient was evaluated and treated and all questions answered.  DX: Right great toe acute gout.  Uric acid 1.8  Discussed with patient diagnosis and treatment options.  Imaging reviewed. No osseous erosions or changes noted in the right great toe.  Discussed most likely diagnosis is gout given history and presentation. Low concern at this time for septic arthritis.   Podiatry to sign off at this time. Contact if any worsening or changes.   Lorenda Peck, DPM  Accessible via secure chat for questions or concerns.

## 2022-06-15 NOTE — Progress Notes (Deleted)
  Echocardiogram 2D Echocardiogram has not been performed. Patient is being moved to the floor.  James [name] M 06/15/2022, 2:02 PM

## 2022-06-16 DIAGNOSIS — J9601 Acute respiratory failure with hypoxia: Secondary | ICD-10-CM

## 2022-06-16 LAB — RENAL FUNCTION PANEL
Albumin: 1.7 g/dL — ABNORMAL LOW (ref 3.5–5.0)
Anion gap: 9 (ref 5–15)
BUN: 35 mg/dL — ABNORMAL HIGH (ref 6–20)
CO2: 22 mmol/L (ref 22–32)
Calcium: 7.8 mg/dL — ABNORMAL LOW (ref 8.9–10.3)
Chloride: 107 mmol/L (ref 98–111)
Creatinine, Ser: 3.26 mg/dL — ABNORMAL HIGH (ref 0.61–1.24)
GFR, Estimated: 22 mL/min — ABNORMAL LOW (ref >60)
Glucose, Bld: 104 mg/dL — ABNORMAL HIGH (ref 70–99)
Phosphorus: 4.8 mg/dL — ABNORMAL HIGH (ref 2.5–4.6)
Potassium: 3.9 mmol/L (ref 3.5–5.1)
Sodium: 138 mmol/L (ref 135–145)

## 2022-06-16 LAB — VITAMIN B1: Vitamin B1 (Thiamine): 50.7 nmol/L — ABNORMAL LOW (ref 66.5–200.0)

## 2022-06-16 LAB — CBC
HCT: 29.8 % — ABNORMAL LOW (ref 39.0–52.0)
Hemoglobin: 10.3 g/dL — ABNORMAL LOW (ref 13.0–17.0)
MCH: 33 pg (ref 26.0–34.0)
MCHC: 34.6 g/dL (ref 30.0–36.0)
MCV: 95.5 fL (ref 80.0–100.0)
Platelets: 164 10*3/uL (ref 150–400)
RBC: 3.12 MIL/uL — ABNORMAL LOW (ref 4.22–5.81)
RDW: 15.5 % (ref 11.5–15.5)
WBC: 10.8 10*3/uL — ABNORMAL HIGH (ref 4.0–10.5)
nRBC: 0 % (ref 0.0–0.2)

## 2022-06-16 LAB — MAGNESIUM: Magnesium: 2.3 mg/dL (ref 1.7–2.4)

## 2022-06-16 MED ORDER — PREDNISONE 20 MG PO TABS
40.0000 mg | ORAL_TABLET | Freq: Every day | ORAL | Status: DC
  Administered 2022-06-16 – 2022-06-20 (×5): 40 mg via ORAL
  Filled 2022-06-16 (×5): qty 2

## 2022-06-16 MED ORDER — SODIUM CHLORIDE 0.9 % IV SOLN
2.0000 g | Freq: Three times a day (TID) | INTRAVENOUS | Status: DC
  Administered 2022-06-16 – 2022-06-23 (×19): 2 g via INTRAVENOUS
  Filled 2022-06-16 (×22): qty 2000

## 2022-06-16 MED ORDER — SODIUM CHLORIDE 0.9 % IV BOLUS
500.0000 mL | Freq: Once | INTRAVENOUS | Status: AC
  Administered 2022-06-16: 500 mL via INTRAVENOUS

## 2022-06-16 MED ORDER — BOOST / RESOURCE BREEZE PO LIQD CUSTOM
1.0000 | Freq: Three times a day (TID) | ORAL | Status: DC
  Administered 2022-06-16 – 2022-06-18 (×3): 1 via ORAL

## 2022-06-16 NOTE — Progress Notes (Signed)
[facility] Progress Note  Subjective: no c/o today, still has wet cough. No SOB. BP's stable, UOP 70 cc today. CVP 2-5 range.   Vitals:   06/16/22 0400 06/16/22 0439 06/16/22 0500 06/16/22 0800  BP: (!) 156/94  (!) 154/98   Pulse: 85  88   Resp: (!) 26  (!) 37   Temp:    97.9 F (36.6 C)  TempSrc:    Axillary  SpO2: 97%  100%   Weight:  58.1 kg    Height:        Exam: General adult male in bed Lungs breathing comfortably; on RA Heart S1s2 no rub tachycardic Abdomen soft, no ascites Extremities no edema lower extremities  Psych no anxiety or agitation  Neuro more readily answers questions today  Access RIJ nontunneled catheter in place    UA cloudy, large Hb, 100 prot, >50rbc, 21-50 wbc/ epis  UNa 69, UCr 101  Assessment/ Plan: AKI - may be secondary to prolonged pre-renal insults leading to ATN.  Setting of alcohol abuse and severe pneumonia.  Hasn't improved thus far with fluids from the ER.  Normotensive here.  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  Serologies were all wnl (ANA, ANCA, GBM and C3/C4). SP CRRT 1/05 - 1/08.  Looked dry so IVF"s started yesterday. UOP minimal, may be lightening up. Cont IVF"s, cont supportive care.  No need for RRT yet.  Severe multifocal pneumonia/ +strep pneumoniae bacteremia - antibiotics per primary team.   Acute hypoxic respiratory failure - is off O2 now AMS - multifactorial setting of acidosis and AKI, alcohol abuse  Transaminitis - related to alcohol abuse  Alcohol abuse - withdrawal protocol per primary team   Rob Markail Diekman 06/16/2022, 11:00 AM   Recent Labs  Lab 06/15/22 0244 06/15/22 0247 06/15/22 1543 06/16/22 0437  HGB 11.1*  --   --  10.3*  ALBUMIN  --  2.0*  --  1.7*  CALCIUM 8.1* 8.0* 7.7* 7.8*  PHOS  --  1.8*  --  4.8*  CREATININE 1.77* 1.89* 2.06* 3.26*  K 3.9 3.9 3.7 3.9    No results for input(s): "IRON", "TIBC", "FERRITIN" in the last 168 hours. Inpatient medications:  Chlorhexidine  Gluconate Cloth  6 each Topical Q2200   docusate sodium  100 mg Oral BID   folic acid  1 mg Intravenous Daily   heparin  5,000 Units Subcutaneous Q8H   lactulose  20 g Oral Daily   multivitamin with minerals  1 tablet Oral Daily   nicotine  21 mg Transdermal Daily   mouth rinse  15 mL Mouth Rinse 4 times per day   pantoprazole (PROTONIX) IV  40 mg Intravenous Q12H   polyethylene glycol  17 g Oral Daily   predniSONE  40 mg Oral Q breakfast    sodium chloride 100 mL/hr at 06/16/22 1012   ampicillin (OMNIPEN) IV Stopped (06/16/22 0925)   thiamine (VITAMIN B1) injection Stopped (06/16/22 1035)   acetaminophen **OR** acetaminophen, fentaNYL (SUBLIMAZE) injection, LORazepam, ondansetron **OR** ondansetron (ZOFRAN) IV, mouth rinse

## 2022-06-16 NOTE — Evaluation (Signed)
Physical Therapy Evaluation Patient Details Name: James [name] MRN: [id number] DOB: December 21, 1970 Today's Date: 06/16/2022  History of Present Illness  Pt is a 52yo male presenting to [facility] on 1/3 with abdominal pain and vomiting. Imaging negative for acute finding. Admitted for evaluation of PNA and AKI. 1/5 CRRT started, stopped 1/8. Episode of SVT. 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  PMH: ETOH abuse, tobacco abuse, pancreatitis.   Clinical Impression  Pt received supine in bed agreeable to be seen. Reports he lives with his elderly mother whom he cares for; he has a brother who could check in on him PRN at home but otherwise no family at home. At baseline he uses a RW "twice a week" and reports he has a history of falling, denies hitting his head or seeking care afterward. Today he required mod assist for bed mobility and demonstrated generalized weakness in all four extremities with some edema noted in bilateral hands, recommending OT consult. After sitting EOB during strength testing encouraged pt to stand with assistance but declined, stating "I'm too tired, I need to nap." Returned pt to supine and repositioned for comfort quarter-turn on L side, pt asleep upon exit. Notified RN of evaluation and vitals during session (see below). Recommending SNF-level therapies upon discharge, pt agreeable. We will continue to follow acutely.     Vitals at start: HR98, SpO2100% on RA, RR35, BP 149/98. Vitals at end: HR110, SpO2 93% on RA (dropped to 86% during sitting EOB but returned quickly with pursed-lip breathing), RR46, BP 149/98.   Recommendations for follow up therapy are one component of a multi-disciplinary discharge planning process, led by the attending physician.  Recommendations may be updated based on patient status, additional functional criteria and insurance authorization.  Follow Up Recommendations Skilled nursing-short term rehab (<3 hours/day) Can patient physically be transported by private  vehicle: No    Assistance Recommended at Discharge Frequent or constant Supervision/Assistance  Patient can return home with the following  Help with stairs or ramp for entrance;Assist for transportation;Assistance with cooking/housework;A lot of help with bathing/dressing/bathroom;A lot of help with walking and/or transfers    Equipment Recommendations None recommended by PT (TBD)  Recommendations for Other Services  OT consult    Functional Status Assessment Patient has had a recent decline in their functional status and demonstrates the ability to make significant improvements in function in a reasonable and predictable amount of time.     Precautions / Restrictions Precautions Precautions: Fall Precaution Comments: pt reports hx of falling Restrictions Weight Bearing Restrictions: No Other Position/Activity Restrictions: wbat      Mobility  Bed Mobility Overal bed mobility: Needs Assistance Bed Mobility: Supine to Sit, Sit to Supine     Supine to sit: Mod assist, HOB elevated Sit to supine: Min assist   General bed mobility comments: Supine to sit: mod assist for trunk elevation, pt required min assist for upright posture at first but was able to maintain upright seated position with BUE support. Tested pt's strength in sitting, requested pt stand and pt declined stating "I'm too tired." Returned pt to supine with min assist for bringing BLE back into bed repositioned in bed onto R side with pillows propping him up, bed alarm on, further mobility deferred    Transfers                   General transfer comment: deferred    Ambulation/Gait  General Gait Details: deferred  Stairs            Wheelchair Mobility    Modified Rankin (Stroke Patients Only)       Balance Overall balance assessment: Needs assistance Sitting-balance support: Feet supported, Bilateral upper extremity supported Sitting balance-Leahy Scale: Fair          Standing balance comment: Did not test                             Pertinent Vitals/Pain Pain Assessment Pain Assessment: No/denies pain    Home Living Family/patient expects to be discharged to:: Private residence Living Arrangements: Parent (Elderly mother on disability) Available Help at Discharge: Family;Available PRN/intermittently (Brother can help but he also works) Type of Home: Apartment Home Access: Stairs to enter Entrance Stairs-Rails: Right;Left;Can reach both Technical brewer of Steps: [place]: One level Home Equipment: Conservation officer, nature (2 wheels);Cane - single point;BSC/3in1      Prior Function Prior Level of Function : Independent/Modified Independent;History of Falls (last six months);Working/employed;Driving Radiographer, therapeutic)             Mobility Comments: RW ADLs Comments: IND     Journalist, newspaper        Extremity/Trunk Assessment   Upper Extremity Assessment Upper Extremity Assessment: Generalized weakness;RUE deficits/detail;LUE deficits/detail RUE Deficits / Details: Edema in the hand reduced grip strength, MMT grossly 3/5 RUE Sensation: WNL LUE Deficits / Details: Edema in the hand reduced grip strength, MMT grossly 3/5 LUE Sensation: WNL    Lower Extremity Assessment Lower Extremity Assessment: RLE deficits/detail;LLE deficits/detail;Generalized weakness RLE Deficits / Details: Pt with reduced ROM and reduced strength, 3/5 grossly at most RLE Sensation: WNL LLE Deficits / Details: Pt with reduced ROM and reduced strength, 3/5 grossly at most LLE Sensation: WNL    Cervical / Trunk Assessment Cervical / Trunk Assessment: Kyphotic  Communication   Communication: No difficulties  Cognition Arousal/Alertness: Awake/alert Behavior During Therapy: WFL for tasks assessed/performed Overall Cognitive Status: Within Functional Limits for tasks assessed                                          General  Comments      Exercises     Assessment/Plan    PT Assessment Patient needs continued PT services  PT Problem List Decreased strength;Decreased range of motion;Decreased activity tolerance;Decreased balance;Decreased mobility;Decreased coordination;Cardiopulmonary status limiting activity       PT Treatment Interventions DME instruction;Gait training;Stair training;Functional mobility training;Therapeutic activities;Therapeutic exercise;Balance training;Neuromuscular re-education;Patient/family education;Wheelchair mobility training    PT Goals (Current goals can be found in the Care Plan section)  Acute Rehab PT Goals Patient Stated Goal: To get stronger PT Goal Formulation: With patient Time For Goal Achievement: 06/30/22 Potential to Achieve Goals: Fair    Frequency Min 2X/week     Co-evaluation               AM-PAC PT "6 Clicks" Mobility  Outcome Measure Help needed turning from your back to your side while in a flat bed without using bedrails?: A Little Help needed moving from lying on your back to sitting on the side of a flat bed without using bedrails?: A Lot Help needed moving to and from a bed to a chair (including a wheelchair)?: Total Help needed standing up from a chair using your  arms (e.g., wheelchair or bedside chair)?: Total Help needed to walk in hospital room?: Total Help needed climbing 3-5 steps with a railing? : Total 6 Click Score: 9    End of Session   Activity Tolerance: Patient limited by fatigue Patient left: in bed;with call bell/phone within reach;with bed alarm set Nurse Communication: Mobility status PT Visit Diagnosis: Difficulty in walking, not elsewhere classified (R26.2);Muscle weakness (generalized) (M62.81);History of falling (Z91.81)    Time: 7096-2836 PT Time Calculation (min) (ACUTE ONLY): 22 min   Charges:   PT Evaluation $PT Eval Moderate Complexity: 1 Mod          Jamesetta Geralds, PT, DPT [facility]  Department Office: [phone] Weekend pager: [phone]  Jamesetta Geralds 06/16/2022, 11:02 AM

## 2022-06-16 NOTE — Progress Notes (Signed)
Initial Nutrition Assessment  DOCUMENTATION CODES:   Non-severe (moderate) malnutrition in context of chronic illness  INTERVENTION:  - Clear liquid diet per MD. Advance to Regular as medically appropriate.  - Boost Breeze po TID, each supplement provides 250 kcal and 9 grams of protein - Encourage intake at all meals.  - Daily multivitamin, folic acid, and thiamine due to history of alcohol abuse.  - Monitor weight trends.    NUTRITION DIAGNOSIS:   Moderate Malnutrition related to chronic illness (alcohol abuse and pancreatitis) as evidenced by moderate fat depletion, moderate muscle depletion.  GOAL:   Patient will meet greater than or equal to 90% of their needs  MONITOR:   PO intake, Supplement acceptance, Diet advancement, Weight trends, Labs  REASON FOR ASSESSMENT:   Malnutrition Screening Tool    ASSESSMENT:   52 year old male with PMH alcohol abuse and pancreatitis who presented on 1/3 with reports of upper abdominal pain, nausea and vomiting. Found to have hepatic steatosis, sepsis, PNA, and AKI requiring CRRT (1/5-1/8).   Patient lethargic at time of visit, difficult to understand.  Reports UBW of 147-150# and weight loss but unable to state time frame. There is no weight history since 2019 to assess for recent changes but patient noted to be weighed at 123# this admission, which could represent weight loss. The loss would be significant if within the past 6 months.  Patient reports eating 2 meals a day at home. Does not drink any supplements.  Patient has remained on a clear liquid diet the past 2 days. Hesitant to try nutrition supplements but agreeable after reviewing importance of nutrition during admission.   Medications reviewed and include: Colace, Folic acid, MVI, Thiamine, Miralax  Labs reviewed:  Creatinine 3.26 Phosphorus 4.8   NUTRITION - FOCUSED PHYSICAL EXAM:  Flowsheet Row Most Recent Value  Orbital Region Moderate depletion  Upper Arm  Region Moderate depletion  Thoracic and Lumbar Region Moderate depletion  Buccal Region Moderate depletion  Temple Region Moderate depletion  Clavicle Bone Region Severe depletion  Clavicle and Acromion Bone Region Moderate depletion  Scapular Bone Region Unable to assess  Dorsal Hand Moderate depletion  Patellar Region Severe depletion  Anterior Thigh Region Severe depletion  Posterior Calf Region Severe depletion  Edema (RD Assessment) Mild  Hair Reviewed  Eyes Reviewed  Mouth Reviewed  Skin Reviewed  Nails Reviewed       Diet Order:   Diet Order             Diet clear liquid Room service appropriate? Yes with Assist; Fluid consistency: Thin  Diet effective now                   EDUCATION NEEDS:  Education needs have been addressed  Skin:  Skin Assessment: Skin Integrity Issues: Skin Integrity Issues:: Stage II Stage II: R upper buttocks  Last BM:  PTA  Height:  Ht Readings from Last 1 Encounters:  06/11/22 5\' 8"  (1.727 m)   Weight:  Wt Readings:  06/15/22 55.6 kg   Ideal Body Weight:  70 kg  BMI:  Body mass index is 18.64 kg/m.  Estimated Nutritional Needs:  Kcal:  1800-1950 kcals Protein:  80-90 grams Fluid:  >/= 1.8L    Samson Frederic RD, LDN For contact information, refer to [facility].

## 2022-06-16 NOTE — Progress Notes (Signed)
PHARMACY NOTE:  ANTIMICROBIAL RENAL DOSAGE ADJUSTMENT  Current antimicrobial regimen includes a mismatch between antimicrobial dosage and estimated renal function.  As per policy approved by the Pharmacy & Therapeutics and Medical Executive Committees, the antimicrobial dosage will be adjusted accordingly.  Current antimicrobial dosage:  ampicillin 2gm IV q6h  Indication: PNA and strep pneumo bacteremia  Renal Function:  Estimated Creatinine Clearance: 21.5 mL/min (A) (by C-G formula based on SCr of 3.26 mg/dL (H)). []      On intermittent HD, scheduled: []      On CRRT    Antimicrobial dosage has been changed to:  ampicillin 2gm q8h    Thank you for allowing pharmacy to be a part of this patient's care.  Lynelle Doctor, [facility] 06/16/2022 8:09 AM

## 2022-06-16 NOTE — Progress Notes (Signed)
PROGRESS NOTE   James [name]  QIO:[id number] DOB: December 24, 1970 DOA: 06/10/2022 PCP: Patient, No Pcp Per  Brief Narrative:  52 year old moderate EtOH prior pancreatitis presented 1/4 upper ABD pain tachypnea tachycardia-found to have severe multifocal consolidative pneumonia with severe sepsis, pancytopenia creatinine >7-- Critical care consulted 1/4 nephrology consulted 1/5 worsening renal failure CRRT started episodic SVT treated with amiodarone adenosine 1/8 podiatry consulted for likely right great toe gout  Hospital-Problem based course  Consolidative severe pneumonia with sepsis on admission Strep pneumo positive and bacteremia present -Continue ampicillin 2 g every 8 - At least 14 days therapy (until 1/16) required may be able to convert to p.o. when more stable -Echo 1/8 shows concern for endocarditis on anterior leaflet so we will ask Dr. 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 - Received CRRT 1/5 through 1/8 and defer fluids and next steps to renal, okay to keep at [facility] long so far  Gout of right great toe - Prednisone started by critical care - Appreciate podiatry input  EtOH abuse, - CIWA was ranging from 4-15 so we will keep on the protocol - Monitor trends  Paroxysmal SVT/A-fib - Amiodarone was given and has been stopped patient is in sinus  DVT prophylaxis: Heparin Code Status: Full Family Communication: None Disposition:  Status is: Inpatient Remains inpatient appropriate because:   Needs TEE needs workup   Consultants:  Nephrology  Procedures: None  Antimicrobials: Ampicillin   Subjective: Responsive but poorly so difficult to get history-states abdomen still quite distended No fever no chills no nausea  Objective: Vitals:   06/16/22 0320  06/16/22 0400 06/16/22 0439 06/16/22 0500  BP:  (!) 156/94  (!) 154/98  Pulse:  85  88  Resp:  (!) 26  (!) 37  Temp: 98.1 F (36.7 C)     TempSrc: Oral     SpO2:  97%  100%  Weight:   58.1 kg   Height:        Intake/Output Summary (Last 24 hours) at 06/16/2022 0723 Last data filed at 06/16/2022 0435 Gross per 24 hour  Intake 2932.91 ml  Output 357 ml  Net 2575.91 ml   Filed Weights   06/13/22 0500 06/15/22 1100 06/16/22 0439  Weight: 58.8 kg 55.6 kg 58.1 kg    Examination:    Data Reviewed: personally reviewed   CBC    Component Value Date/Time   WBC 10.8 (H) 06/16/2022 0437   RBC 3.12 (L) 06/16/2022 0437   HGB 10.3 (L) 06/16/2022 0437   HCT 29.8 (L) 06/16/2022 0437   PLT 164 06/16/2022 0437   MCV 95.5 06/16/2022 0437   MCH 33.0 06/16/2022 0437   MCHC 34.6 06/16/2022 0437   RDW 15.5 06/16/2022 0437   LYMPHSABS 0.1 (L) 06/12/2022 0304   MONOABS 0.1 06/12/2022 0304   EOSABS 0.0 06/12/2022 0304   BASOSABS 0.0 06/12/2022 0304      Latest Ref Rng & Units 06/16/2022    4:37 AM 06/15/2022    3:43 PM 06/15/2022    2:47 AM  CMP  Glucose 70 - 99 mg/dL 104  117  107   BUN 6 - 20 mg/dL 35  22  24   Creatinine 0.61 - 1.24 mg/dL 3.26  2.06  1.89   Sodium 135 - 145 mmol/L 138  140  135  Potassium 3.5 - 5.1 mmol/L 3.9  3.7  3.9   Chloride 98 - 111 mmol/L 107  105  102   CO2 22 - 32 mmol/L 22  25  24    Calcium 8.9 - 10.3 mg/dL 7.8  7.7  8.0      Radiology Studies: ECHOCARDIOGRAM COMPLETE  Result Date: 06/15/2022    ECHOCARDIOGRAM REPORT   Patient Name:   James [name] Date of Exam: 06/15/2022 Medical Rec #:  [id number]           Height:       68.0 in Accession #:    [id number]          Weight:       122.6 lb Date of Birth:  December 14, 1970           BSA:          1.660 m Patient Age:    26 years            BP:           100/79 mmHg Patient Gender: M                   HR:           90 bpm. Exam Location:  Inpatient Procedure: 2D Echo, Color Doppler and Cardiac Doppler  Indications:    Bacteremia  History:        Patient has no prior history of Echocardiogram examinations.                 Risk Factors:Hypertension.  Sonographer:    Danne Baxter RDCS, FE, PE Referring Phys: [phone] Hortencia Conradi [facility]  Sonographer Comments: No subcostal window. IMPRESSIONS  1. There is shagginess of the anterior leaflet of the mitral valve, ventricular surface. Consider TEE for further evaluation. The mitral valve is grossly normal. No evidence of mitral valve regurgitation. No evidence of mitral stenosis.  2. Left ventricular ejection fraction, by estimation, is 60 to 65%. The left ventricle has normal function. The left ventricle has no regional wall motion abnormalities. Left ventricular diastolic parameters are consistent with Grade I diastolic dysfunction (impaired relaxation).  3. Right ventricular systolic function is normal. The right ventricular size is normal.  4. The aortic valve is normal in structure. Aortic valve regurgitation is not visualized. No aortic stenosis is present.  5. The inferior vena cava is normal in size with greater than 50% respiratory variability, suggesting right atrial pressure of 3 mmHg. FINDINGS  Left Ventricle: Left ventricular ejection fraction, by estimation, is 60 to 65%. The left ventricle has normal function. The left ventricle has no regional wall motion abnormalities. The left ventricular internal cavity size was normal in size. There is  no left ventricular hypertrophy. Left ventricular diastolic parameters are consistent with Grade I diastolic dysfunction (impaired relaxation). Right Ventricle: The right ventricular size is normal. No increase in right ventricular wall thickness. Right ventricular systolic function is normal. Left Atrium: Left atrial size was normal in size. Right Atrium: Right atrial size was normal in size. Pericardium: There is no evidence of pericardial effusion. Mitral Valve: There is shagginess of the anterior leaflet of the mitral  valve, ventricular surface. Consider TEE for further evaluation. The mitral valve is grossly normal. There is mild thickening of the mitral valve leaflet(s). No evidence of mitral valve regurgitation. No evidence of mitral valve stenosis. Tricuspid Valve: The tricuspid valve is normal in structure. Tricuspid valve regurgitation is not demonstrated. No evidence of tricuspid  stenosis. Aortic Valve: The aortic valve is normal in structure. Aortic valve regurgitation is not visualized. No aortic stenosis is present. Aortic valve mean gradient measures 2.0 mmHg. Aortic valve peak gradient measures 3.2 mmHg. Aortic valve area, by VTI measures 2.93 cm. Pulmonic Valve: The pulmonic valve was normal in structure. Pulmonic valve regurgitation is not visualized. No evidence of pulmonic stenosis. Aorta: The aortic root is normal in size and structure. Venous: The inferior vena cava is normal in size with greater than 50% respiratory variability, suggesting right atrial pressure of 3 mmHg. IAS/Shunts: No atrial level shunt detected by color flow Doppler.  LEFT VENTRICLE PLAX 2D LVIDd:         4.60 cm   Diastology LVIDs:         2.80 cm   LV e' medial:    6.20 cm/s LV PW:         0.80 cm   LV E/e' medial:  4.8 LV IVS:        0.80 cm   LV e' lateral:   10.10 cm/s LVOT diam:     2.20 cm   LV E/e' lateral: 2.9 LV SV:         38 LV SV Index:   23 LVOT Area:     3.80 cm  RIGHT VENTRICLE RV S prime:     9.46 cm/s TAPSE (M-mode): 1.3 cm LEFT ATRIUM             Index        RIGHT ATRIUM           Index LA diam:        2.90 cm 1.75 cm/m   RA Area:     13.70 cm LA Vol (A2C):   44.7 ml 26.93 ml/m  RA Volume:   33.20 ml  20.00 ml/m LA Vol (A4C):   22.9 ml 13.80 ml/m LA Biplane Vol: 32.5 ml 19.58 ml/m  AORTIC VALVE AV Area (Vmax):    2.91 cm AV Area (Vmean):   2.69 cm AV Area (VTI):     2.93 cm AV Vmax:           89.90 cm/s AV Vmean:          72.900 cm/s AV VTI:            0.129 m AV Peak Grad:      3.2 mmHg AV Mean Grad:      2.0  mmHg LVOT Vmax:         68.90 cm/s LVOT Vmean:        51.600 cm/s LVOT VTI:          0.100 m LVOT/AV VTI ratio: 0.77  AORTA Ao Root diam: 3.60 cm MITRAL VALVE MV Area (PHT): 5.88 cm    SHUNTS MV Decel Time: 129 msec    Systemic VTI:  0.10 m MV E velocity: 29.50 cm/s  Systemic Diam: 2.20 cm MV A velocity: 46.30 cm/s MV E/A ratio:  0.64 Candee Furbish MD Electronically signed by Candee Furbish MD Signature Date/Time: 06/15/2022/3:46:41 PM    Final    DG Foot 2 Views Right  Result Date: 06/15/2022 CLINICAL DATA:  Right toe pain.  No injury. EXAM: RIGHT FOOT - 2 VIEW COMPARISON:  None Available. FINDINGS: No acute fracture or dislocation. Mild-to-moderate first MTP joint space narrowing with small marginal osteophytes and subchondral cystic change in the first metatarsal head. Remaining joint spaces are preserved. Bone mineralization is normal. 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 Soft tissues are unremarkable. IMPRESSION: 1. 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 No acute osseous abnormality. Electronically Signed   By: Obie Dredge M.D.   On: 06/15/2022 10:02   DG CHEST PORT 1 VIEW  Result Date: 06/15/2022 CLINICAL DATA:  Tachypnea. EXAM: PORTABLE CHEST 1 VIEW COMPARISON:  Chest x-ray dated June 13, 2022. FINDINGS: Unchanged enteric tube in the stomach. Unchanged right internal jugular dialysis catheter. Stable cardiomediastinal silhouette. Similar small bilateral layering pleural effusions and bibasilar consolidation. No pneumothorax. No acute osseous abnormality. IMPRESSION: 1. Similar small bilateral pleural effusions and bibasilar pneumonia and/or atelectasis. Electronically Signed   By: Obie Dredge M.D.   On: 06/15/2022 10:00     Scheduled Meds:  Chlorhexidine Gluconate Cloth  6 each Topical Q2200   docusate sodium  100 mg Oral BID   folic acid  1 mg Intravenous Daily   heparin  5,000 Units Subcutaneous Q8H   lactulose  20 g Oral Daily   multivitamin with minerals  1 tablet Oral  Daily   nicotine  21 mg Transdermal Daily   mouth rinse  15 mL Mouth Rinse 4 times per day   pantoprazole (PROTONIX) IV  40 mg Intravenous Q12H   polyethylene glycol  17 g Oral Daily   Continuous Infusions:  sodium chloride 100 mL/hr at 06/16/22 0411   ampicillin (OMNIPEN) IV Stopped (06/16/22 0250)   norepinephrine (LEVOPHED) Adult infusion Stopped (06/13/22 0604)   thiamine (VITAMIN B1) injection Stopped (06/15/22 1207)     LOS: 5 days   Time spent: 68  Rhetta Mura, MD [facility] To contact the attending provider between 7A-7P or the covering provider during after hours 7P-7A, please log into the web site [email or web address] and access using universal [place] password for that web site. If you do not have the password, please call the hospital operator.  06/16/2022, 7:23 AM

## 2022-06-16 NOTE — Consult Note (Signed)
Referring Physician: Pleas Koch, MD  James [name] is an 52 y.o. male.                       Chief Complaint: Bacteremia in patient with multifocal pneumonia  HPI: 52 years old black male with PMH of pancreatitis, gout, SVT, gout, acute kidney injury, alcohol abuse and tobacco use disorder has multifocal pneumonia and strep pneumo bacteremia. He denies chest pain and palpitation.   Past Medical History:  Diagnosis Date   Alcohol abuse    Pancreatitis    Tobacco abuse       History reviewed. No pertinent surgical history.  History reviewed. No pertinent family history. Social History:  reports that he has been smoking cigarettes. He has been smoking an average of 1 pack per day. He has never used smokeless tobacco. He reports current alcohol use. He reports that he does not currently use drugs.  Allergies: No Known Allergies  No medications prior to admission.    Results for orders placed or performed during the hospital encounter of 06/10/22 (from the past 48 hour(s))  Magnesium     Status: Abnormal   Collection Time: 06/15/22  2:44 AM  Result Value Ref Range   Magnesium 2.8 (H) 1.7 - 2.4 mg/dL    Comment: Performed at [facility], [address] [place], [place] [address]  CBC     Status: Abnormal   Collection Time: 06/15/22  2:44 AM  Result Value Ref Range   WBC 10.3 4.0 - 10.5 K/uL   RBC 3.33 (L) 4.22 - 5.81 MIL/uL   Hemoglobin 11.1 (L) 13.0 - 17.0 g/dL   HCT 55.9 (L) 74.1 - 63.8 %   MCV 97.6 80.0 - 100.0 fL   MCH 33.3 26.0 - 34.0 pg   MCHC 34.2 30.0 - 36.0 g/dL   RDW 45.3 64.6 - 80.3 %   Platelets 100 (L) 150 - 400 K/uL   nRBC 0.0 0.0 - 0.2 %    Comment: Performed at [facility], [address] [place], [place] [address]  Basic metabolic panel     Status: Abnormal   Collection Time: 06/15/22  2:44 AM  Result Value Ref Range   Sodium 136 135 - 145 mmol/L   Potassium 3.9 3.5 - 5.1 mmol/L   Chloride 103 98 - 111  mmol/L   CO2 24 22 - 32 mmol/L   Glucose, Bld 105 (H) 70 - 99 mg/dL    Comment: Glucose reference range applies only to samples taken after fasting for at least 8 hours.   BUN 21 (H) 6 - 20 mg/dL   Creatinine, Ser 8.25 (H) 0.61 - 1.24 mg/dL   Calcium 8.1 (L) 8.9 - 10.3 mg/dL   GFR, Estimated 45 (L) >60 mL/min    Comment: (NOTE) Calculated using the CKD-EPI Creatinine Equation (2021)    Anion gap 9 5 - 15    Comment: Performed at [facility], [address] [place], [place] [address]  Renal function panel (daily at 0500)     Status: Abnormal   Collection Time: 06/15/22  2:47 AM  Result Value Ref Range   Sodium 135 135 - 145 mmol/L   Potassium 3.9 3.5 - 5.1 mmol/L   Chloride 102 98 - 111 mmol/L   CO2 24 22 - 32 mmol/L   Glucose, Bld 107 (H) 70 - 99 mg/dL    Comment: Glucose reference range applies only to samples taken  after fasting for at least 8 hours.   BUN 24 (H) 6 - 20 mg/dL   Creatinine, Ser 3.82 (H) 0.61 - 1.24 mg/dL   Calcium 8.0 (L) 8.9 - 10.3 mg/dL   Phosphorus 1.8 (L) 2.5 - 4.6 mg/dL   Albumin 2.0 (L) 3.5 - 5.0 g/dL   GFR, Estimated 42 (L) >60 mL/min    Comment: (NOTE) Calculated using the CKD-EPI Creatinine Equation (2021)    Anion gap 9 5 - 15    Comment: Performed at [facility], [address] [place], [place] [address]  Glucose, capillary     Status: None   Collection Time: 06/15/22  3:24 AM  Result Value Ref Range   Glucose-Capillary 95 70 - 99 mg/dL    Comment: Glucose reference range applies only to samples taken after fasting for at least 8 hours.  Blood gas, venous     Status: Abnormal   Collection Time: 06/15/22  9:44 AM  Result Value Ref Range   pH, Ven 7.46 (H) 7.25 - 7.43   pCO2, Ven 43 (L) 44 - 60 mmHg   pO2, Ven <31 (LL) 32 - 45 mmHg    Comment: CRITICAL RESULT CALLED TO, READ BACK BY AND VERIFIED WITH: WEAVER,S. RN AT 7673 06/15/22 MULLINS,T    Bicarbonate 30.6 (H) 20.0 - 28.0 mmol/L   Acid-Base Excess  6.2 (H) 0.0 - 2.0 mmol/L   O2 Saturation 29.5 %   Patient temperature 37.4     Comment: Performed at [facility], [address], [place], [place] [address]  Culture, blood (Routine X 2) w Reflex to ID Panel     Status: None (Preliminary result)   Collection Time: 06/15/22  9:45 AM   Specimen: BLOOD RIGHT ARM  Result Value Ref Range   Specimen Description      BLOOD RIGHT ARM BOTTLES DRAWN AEROBIC ONLY Performed at [facility], [address], [place], [place] [address]    Special Requests      Blood Culture adequate volume Performed at [facility], [address], [place], [place] [address]    Culture      NO GROWTH < 24 HOURS Performed at [facility], [address], [place], [place] [address]    Report Status PENDING   Culture, blood (Routine X 2) w Reflex to ID Panel     Status: None (Preliminary result)   Collection Time: 06/15/22  9:45 AM   Specimen: BLOOD RIGHT FOREARM  Result Value Ref Range   Specimen Description      BLOOD RIGHT FOREARM BOTTLES DRAWN AEROBIC ONLY Performed at [facility], [address], [place], [place] [address]    Special Requests      Blood Culture adequate volume Performed at [facility], [address] [place], [place] [address]    Culture      NO GROWTH < 24 HOURS Performed at [facility], [address] [place], [place] [address]    Report Status PENDING   Uric acid     Status: Abnormal   Collection Time: 06/15/22  9:45 AM  Result Value Ref Range   Uric Acid, Serum 1.8 (L) 3.7 - 8.6 mg/dL    Comment: Performed at [facility], [address], [place], [place] [address]  Basic metabolic panel     Status: Abnormal   Collection Time: 06/15/22  3:43 PM  Result Value Ref Range  Sodium 140 135 - 145 mmol/L   Potassium 3.7 3.5 - 5.1 mmol/L   Chloride 105 98 - 111 mmol/L   CO2 25 22 - 32 mmol/L   Glucose,  Bld 117 (H) 70 - 99 mg/dL    Comment: Glucose reference range applies only to samples taken after fasting for at least 8 hours.   BUN 22 (H) 6 - 20 mg/dL   Creatinine, Ser 0.88 (H) 0.61 - 1.24 mg/dL   Calcium 7.7 (L) 8.9 - 10.3 mg/dL   GFR, Estimated 38 (L) >60 mL/min    Comment: (NOTE) Calculated using the CKD-EPI Creatinine Equation (2021)    Anion gap 10 5 - 15    Comment: Performed at [facility], [address] [place], [place] [address]  Renal function panel     Status: Abnormal   Collection Time: 06/16/22  4:37 AM  Result Value Ref Range   Sodium 138 135 - 145 mmol/L   Potassium 3.9 3.5 - 5.1 mmol/L   Chloride 107 98 - 111 mmol/L   CO2 22 22 - 32 mmol/L   Glucose, Bld 104 (H) 70 - 99 mg/dL    Comment: Glucose reference range applies only to samples taken after fasting for at least 8 hours.   BUN 35 (H) 6 - 20 mg/dL   Creatinine, Ser 5.94 (H) 0.61 - 1.24 mg/dL    Comment: DELTA CHECK NOTED   Calcium 7.8 (L) 8.9 - 10.3 mg/dL   Phosphorus 4.8 (H) 2.5 - 4.6 mg/dL   Albumin 1.7 (L) 3.5 - 5.0 g/dL   GFR, Estimated 22 (L) >60 mL/min    Comment: (NOTE) Calculated using the CKD-EPI Creatinine Equation (2021)    Anion gap 9 5 - 15    Comment: Performed at [facility], [address] [place], [place] [address]  CBC     Status: Abnormal   Collection Time: 06/16/22  4:37 AM  Result Value Ref Range   WBC 10.8 (H) 4.0 - 10.5 K/uL   RBC 3.12 (L) 4.22 - 5.81 MIL/uL   Hemoglobin 10.3 (L) 13.0 - 17.0 g/dL   HCT 92.4 (L) 46.2 - 86.3 %   MCV 95.5 80.0 - 100.0 fL   MCH 33.0 26.0 - 34.0 pg   MCHC 34.6 30.0 - 36.0 g/dL   RDW 81.7 71.1 - 65.7 %   Platelets 164 150 - 400 K/uL   nRBC 0.0 0.0 - 0.2 %    Comment: Performed at [facility], [address], [place], [place] [address]  Magnesium     Status: None   Collection Time: 06/16/22  4:37 AM  Result Value Ref Range   Magnesium 2.3 1.7 - 2.4 mg/dL    Comment: Performed at  [facility], [address], [place], [place] [address]   ECHOCARDIOGRAM COMPLETE  Result Date: 06/15/2022    ECHOCARDIOGRAM REPORT   Patient Name:   James [name] Date of Exam: 06/15/2022 Medical Rec #:  [id number]           Height:       68.0 in Accession #:    [id number]          Weight:       122.6 lb Date of Birth:  Jan 13, 1971           BSA:          1.660 m Patient Age:    36 years  BP:           100/79 mmHg Patient Gender: M                   HR:           90 bpm. Exam Location:  Inpatient Procedure: 2D Echo, Color Doppler and Cardiac Doppler Indications:    Bacteremia  History:        Patient has no prior history of Echocardiogram examinations.                 Risk Factors:Hypertension.  Sonographer:    Melton Krebs RDCS, FE, PE Referring Phys: [phone] Ivor Costa [facility]  Sonographer Comments: No subcostal window. IMPRESSIONS  1. There is shagginess of the anterior leaflet of the mitral valve, ventricular surface. Consider TEE for further evaluation. The mitral valve is grossly normal. No evidence of mitral valve regurgitation. No evidence of mitral stenosis.  2. Left ventricular ejection fraction, by estimation, is 60 to 65%. The left ventricle has normal function. The left ventricle has no regional wall motion abnormalities. Left ventricular diastolic parameters are consistent with Grade I diastolic dysfunction (impaired relaxation).  3. Right ventricular systolic function is normal. The right ventricular size is normal.  4. The aortic valve is normal in structure. Aortic valve regurgitation is not visualized. No aortic stenosis is present.  5. The inferior vena cava is normal in size with greater than 50% respiratory variability, suggesting right atrial pressure of 3 mmHg. FINDINGS  Left Ventricle: Left ventricular ejection fraction, by estimation, is 60 to 65%. The left ventricle has normal function. The left ventricle has no regional wall motion abnormalities. The  left ventricular internal cavity size was normal in size. There is  no left ventricular hypertrophy. Left ventricular diastolic parameters are consistent with Grade I diastolic dysfunction (impaired relaxation). Right Ventricle: The right ventricular size is normal. No increase in right ventricular wall thickness. Right ventricular systolic function is normal. Left Atrium: Left atrial size was normal in size. Right Atrium: Right atrial size was normal in size. Pericardium: There is no evidence of pericardial effusion. Mitral Valve: There is shagginess of the anterior leaflet of the mitral valve, ventricular surface. Consider TEE for further evaluation. The mitral valve is grossly normal. There is mild thickening of the mitral valve leaflet(s). No evidence of mitral valve regurgitation. No evidence of mitral valve stenosis. Tricuspid Valve: The tricuspid valve is normal in structure. Tricuspid valve regurgitation is not demonstrated. No evidence of tricuspid stenosis. Aortic Valve: The aortic valve is normal in structure. Aortic valve regurgitation is not visualized. No aortic stenosis is present. Aortic valve mean gradient measures 2.0 mmHg. Aortic valve peak gradient measures 3.2 mmHg. Aortic valve area, by VTI measures 2.93 cm. Pulmonic Valve: The pulmonic valve was normal in structure. Pulmonic valve regurgitation is not visualized. No evidence of pulmonic stenosis. Aorta: The aortic root is normal in size and structure. Venous: The inferior vena cava is normal in size with greater than 50% respiratory variability, suggesting right atrial pressure of 3 mmHg. IAS/Shunts: No atrial level shunt detected by color flow Doppler.  LEFT VENTRICLE PLAX 2D LVIDd:         4.60 cm   Diastology LVIDs:         2.80 cm   LV e' medial:    6.20 cm/s LV PW:         0.80 cm   LV E/e' medial:  4.8 LV IVS:  0.80 cm   LV e' lateral:   10.10 cm/s LVOT diam:     2.20 cm   LV E/e' lateral: 2.9 LV SV:         38 LV SV Index:   23  LVOT Area:     3.80 cm  RIGHT VENTRICLE RV S prime:     9.46 cm/s TAPSE (M-mode): 1.3 cm LEFT ATRIUM             Index        RIGHT ATRIUM           Index LA diam:        2.90 cm 1.75 cm/m   RA Area:     13.70 cm LA Vol (A2C):   44.7 ml 26.93 ml/m  RA Volume:   33.20 ml  20.00 ml/m LA Vol (A4C):   22.9 ml 13.80 ml/m LA Biplane Vol: 32.5 ml 19.58 ml/m  AORTIC VALVE AV Area (Vmax):    2.91 cm AV Area (Vmean):   2.69 cm AV Area (VTI):     2.93 cm AV Vmax:           89.90 cm/s AV Vmean:          72.900 cm/s AV VTI:            0.129 m AV Peak Grad:      3.2 mmHg AV Mean Grad:      2.0 mmHg LVOT Vmax:         68.90 cm/s LVOT Vmean:        51.600 cm/s LVOT VTI:          0.100 m LVOT/AV VTI ratio: 0.77  AORTA Ao Root diam: 3.60 cm MITRAL VALVE MV Area (PHT): 5.88 cm    SHUNTS MV Decel Time: 129 msec    Systemic VTI:  0.10 m MV E velocity: 29.50 cm/s  Systemic Diam: 2.20 cm MV A velocity: 46.30 cm/s MV E/A ratio:  0.64 Candee Furbish MD Electronically signed by Candee Furbish MD Signature Date/Time: 06/15/2022/3:46:41 PM    Final    DG Foot 2 Views Right  Result Date: 06/15/2022 CLINICAL DATA:  Right toe pain.  No injury. EXAM: RIGHT FOOT - 2 VIEW COMPARISON:  None Available. FINDINGS: No acute fracture or dislocation. Mild-to-moderate first MTP joint space narrowing with small marginal osteophytes and subchondral cystic change in the first metatarsal head. Remaining joint spaces are preserved. Bone mineralization is normal. 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 Soft tissues are unremarkable. IMPRESSION: 1. 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 No acute osseous abnormality. Electronically Signed   By: Titus Dubin M.D.   On: 06/15/2022 10:02   DG CHEST PORT 1 VIEW  Result Date: 06/15/2022 CLINICAL DATA:  Tachypnea. EXAM: PORTABLE CHEST 1 VIEW COMPARISON:  Chest x-ray dated June 13, 2022. FINDINGS: Unchanged enteric tube in the stomach. Unchanged right internal jugular dialysis catheter. Stable  cardiomediastinal silhouette. Similar small bilateral layering pleural effusions and bibasilar consolidation. No pneumothorax. No acute osseous abnormality. IMPRESSION: 1. Similar small bilateral pleural effusions and bibasilar pneumonia and/or atelectasis. Electronically Signed   By: Titus Dubin M.D.   On: 06/15/2022 10:00    Review Of Systems Constitutional: Positive fever, chills, weight loss. Eyes: No vision change, wears glasses. No discharge or pain. Ears: No hearing loss, No tinnitus. Respiratory: No asthma, positive COPD, pneumonias, shortness of breath. No hemoptysis. Cardiovascular: No chest pain, palpitation, leg edema. Gastrointestinal: Positive nausea, vomiting, no diarrhea, constipation. No GI bleed.  No hepatitis. Genitourinary: No dysuria, hematuria, kidney stone. No incontinance. Neurological: No headache, stroke, seizures.  Psychiatry: No psych facility admission for anxiety, depression, suicide. No detox. Skin: No rash. Musculoskeletal: No joint pain, fibromyalgia. No neck pain, back pain. Lymphadenopathy: No lymphadenopathy. Hematology: No anemia or easy bruising.   Blood pressure (!) 136/101, pulse 88, temperature 97.9 F (36.6 C), temperature source Axillary, resp. rate (!) 39, height 5\' 8"  (1.727 m), weight 58.1 kg, SpO2 99 %. Body mass index is 19.48 kg/m. General appearance: alert, cooperative, appears stated age and mild respiratory distress Head: Normocephalic, atraumatic. Eyes: Brown eyes, pink conjunctiva, corneas clear. Neck: No adenopathy, no carotid bruit, no JVD, supple, symmetrical, trachea midline and thyroid not enlarged. Resp: Clear to auscultation bilaterally. Cardio: Regular rate and rhythm, S1, S2 normal, II/VI systolic murmur, no click, rub or gallop GI: Soft, non-tender; bowel sounds normal; no organomegaly. Extremities: No edema, cyanosis or clubbing. Skin: Warm and dry.  Neurologic: Alert and oriented X 3, normal  strength.  Assessment/Plan Bacteremia r/o endocarditis Multifocal pneumonia Alcohol use disorder Tobacco use disorder Gout Acute on chronic kidney disease  Plan: Discussed TEE procedure and risks. Patient scheduled for TEE to r/o endocarditis.  Time spent: Review of old records, Lab, x-rays, EKG, other cardiac tests, examination, discussion with patient/Doctor/Nurse over 70 minutes.  , MD  06/16/2022, 3:59 PM

## 2022-06-17 ENCOUNTER — Encounter (HOSPITAL_COMMUNITY): Payer: Self-pay | Admitting: Certified Registered"

## 2022-06-17 ENCOUNTER — Encounter (HOSPITAL_COMMUNITY): Payer: Self-pay | Admitting: Internal Medicine

## 2022-06-17 ENCOUNTER — Inpatient Hospital Stay (HOSPITAL_COMMUNITY): Payer: BLUE CROSS/BLUE SHIELD

## 2022-06-17 ENCOUNTER — Other Ambulatory Visit (HOSPITAL_COMMUNITY): Payer: BLUE CROSS/BLUE SHIELD

## 2022-06-17 ENCOUNTER — Encounter (HOSPITAL_COMMUNITY)
Admission: EM | Disposition: A | Discharge status: Home Health | Payer: Self-pay | Source: Home / Self Care | Attending: Internal Medicine

## 2022-06-17 DIAGNOSIS — N170 Acute kidney failure with tubular necrosis: Secondary | ICD-10-CM | POA: Diagnosis not present

## 2022-06-17 DIAGNOSIS — L8990 Pressure ulcer of unspecified site, unspecified stage: Secondary | ICD-10-CM | POA: Insufficient documentation

## 2022-06-17 DIAGNOSIS — M109 Gout, unspecified: Secondary | ICD-10-CM

## 2022-06-17 DIAGNOSIS — G9341 Metabolic encephalopathy: Secondary | ICD-10-CM | POA: Diagnosis present

## 2022-06-17 DIAGNOSIS — F1010 Alcohol abuse, uncomplicated: Secondary | ICD-10-CM | POA: Diagnosis present

## 2022-06-17 DIAGNOSIS — F17210 Nicotine dependence, cigarettes, uncomplicated: Secondary | ICD-10-CM

## 2022-06-17 DIAGNOSIS — A403 Sepsis due to Streptococcus pneumoniae: Secondary | ICD-10-CM | POA: Diagnosis not present

## 2022-06-17 DIAGNOSIS — E440 Moderate protein-calorie malnutrition: Secondary | ICD-10-CM

## 2022-06-17 DIAGNOSIS — J9601 Acute respiratory failure with hypoxia: Secondary | ICD-10-CM | POA: Diagnosis not present

## 2022-06-17 DIAGNOSIS — R6520 Severe sepsis without septic shock: Secondary | ICD-10-CM

## 2022-06-17 LAB — RENAL FUNCTION PANEL
Albumin: 1.8 g/dL — ABNORMAL LOW (ref 3.5–5.0)
Anion gap: 11 (ref 5–15)
BUN: 55 mg/dL — ABNORMAL HIGH (ref 6–20)
CO2: 18 mmol/L — ABNORMAL LOW (ref 22–32)
Calcium: 8.3 mg/dL — ABNORMAL LOW (ref 8.9–10.3)
Chloride: 111 mmol/L (ref 98–111)
Creatinine, Ser: 4.42 mg/dL — ABNORMAL HIGH (ref 0.61–1.24)
GFR, Estimated: 15 mL/min — ABNORMAL LOW (ref >60)
Glucose, Bld: 123 mg/dL — ABNORMAL HIGH (ref 70–99)
Phosphorus: 6.9 mg/dL — ABNORMAL HIGH (ref 2.5–4.6)
Potassium: 4.2 mmol/L (ref 3.5–5.1)
Sodium: 140 mmol/L (ref 135–145)

## 2022-06-17 LAB — CBC
HCT: 29.7 % — ABNORMAL LOW (ref 39.0–52.0)
Hemoglobin: 10.4 g/dL — ABNORMAL LOW (ref 13.0–17.0)
MCH: 33.9 pg (ref 26.0–34.0)
MCHC: 35 g/dL (ref 30.0–36.0)
MCV: 96.7 fL (ref 80.0–100.0)
Platelets: 241 10*3/uL (ref 150–400)
RBC: 3.07 MIL/uL — ABNORMAL LOW (ref 4.22–5.81)
RDW: 15.8 % — ABNORMAL HIGH (ref 11.5–15.5)
WBC: 12.4 10*3/uL — ABNORMAL HIGH (ref 4.0–10.5)
nRBC: 0 % (ref 0.0–0.2)

## 2022-06-17 SURGERY — CANCELLED PROCEDURE

## 2022-06-17 MED ORDER — HEPARIN SODIUM (PORCINE) 1000 UNIT/ML DIALYSIS
1000.0000 [IU] | INTRAMUSCULAR | Status: DC | PRN
  Filled 2022-06-17: qty 1

## 2022-06-17 MED ORDER — PANTOPRAZOLE SODIUM 40 MG PO TBEC
40.0000 mg | DELAYED_RELEASE_TABLET | Freq: Every day | ORAL | Status: DC
  Administered 2022-06-18 – 2022-06-25 (×8): 40 mg via ORAL
  Filled 2022-06-17 (×9): qty 1

## 2022-06-17 MED ORDER — ANTICOAGULANT SODIUM CITRATE 4% (200MG/5ML) IV SOLN: 5.0000 mL | Status: DC | PRN

## 2022-06-17 MED ORDER — CHLORHEXIDINE GLUCONATE CLOTH 2 % EX PADS
6.0000 | MEDICATED_PAD | Freq: Every day | CUTANEOUS | Status: DC
  Administered 2022-06-18 – 2022-06-22 (×4): 6 via TOPICAL

## 2022-06-17 MED ORDER — HYDRALAZINE HCL 20 MG/ML IJ SOLN
10.0000 mg | Freq: Four times a day (QID) | INTRAMUSCULAR | Status: DC | PRN
  Administered 2022-06-17: 10 mg via INTRAVENOUS
  Filled 2022-06-17: qty 1

## 2022-06-17 MED ORDER — LACTATED RINGERS IV SOLN: INTRAVENOUS | Status: DC

## 2022-06-17 MED ORDER — ALTEPLASE 2 MG IJ SOLR: 2.0000 mg | Freq: Once | INTRAMUSCULAR | Status: DC | PRN

## 2022-06-17 MED ORDER — HEPARIN SODIUM (PORCINE) 1000 UNIT/ML DIALYSIS
2500.0000 [IU] | INTRAMUSCULAR | Status: DC | PRN
  Administered 2022-06-17: 2500 [IU] via INTRAVENOUS_CENTRAL
  Filled 2022-06-17: qty 3

## 2022-06-17 NOTE — Assessment & Plan Note (Signed)
Nutrition following, their input is appreciated Boost nutritional supplements Assisting patient with all meals Daily multivitamin

## 2022-06-17 NOTE — Progress Notes (Signed)
PROGRESS NOTE   James [name]  YNW:[id number] DOB: May 10, 1971 DOA: 06/10/2022 PCP: Patient, No Pcp Per   Date of Service: the patient was seen and examined on 06/17/2022  Brief Narrative:  52 year old male with past medical history of alcoholism presenting to [facility] emergency department with abdominal pain and shortness of breath.  Upon evaluation in the emergency department patient was found to be extremely short of breath and tachypneic with multiple SIRS criteria for which the patient was initially placed on high flow oxygen.  Workup revealed acute kidney injury with creatinine of 7.74 as well as right middle lobe and bibasilar pneumonia on chest imaging concerning for sepsis.  Patient was placed on intravenous fluids and broad-spectrum intravenous antibiotics and the hospitalist group was then called to assess the patient for admission to the hospital.  Patient's creatinine initially improved with the guidance of Dr. Jonnie Finner with nephrology via consultation.  Patient did receive CRRT from 1/5 until 1/8 through a right IJ hemodialysis catheter.  Unfortunately, creatinine began to increase bringing about concern for ATN.  Workup included ANA/ANCA/GBM/complement levels all of which were unremarkable.    Concerning patient's sepsis and pneumonia, patient was initially treated with broad-spectrum antibiotics.   Blood cultures obtained and 1/4 revealed Streptococcus pneumoniae bacteremia.  Antibiotics were then de-escalated to ampicillin.  However echocardiogram ( EF 60-65% , grade I diastolic dysfunction) on 1/8 revealed concern for endocarditis on the anterior leaflet of the mitral valve.  Dr. Doylene Canard with cardiology was then consulted for consideration of TEE.  Patient appeared to develop right great toe gout on 1/8 for which podiatry was consulted.  Patient has been managed with prednisone 40 mg daily started on 1/9.  On 1/10 due to rising creatinine patient is being transferred  to [facility] at the recommendation of Dr. Jonnie Finner nephrology for initiation of hemodialysis.     Assessment and Plan: * Acute respiratory failure with hypoxia (HCC) Improving, patient is been weaned to room air Remainder of assessment and plan as above  Sepsis due to Streptococcus pneumoniae (HCC) Persisting leukocytosis with bouts of tachycardia Continuing intravenous ampicillin to complete at least 14 days of therapy Strep pneumoniae bacteremia based on blood cultures 1/4.   Blood cultures 1/8 revealed no growth at this time. Echocardiogram 1/8 revealing possible vegetation on anterior leaflet of mitral valve, patient to undergo TEE today  Acute kidney injury (AKI) with acute tubular necrosis (ATN) ([place]) Patient initially presented with creatinine of 7.74 with initial substantial improvement.   Patient received CRRT from 1/5 until 1/8 through right IJ hemodialysis catheter Now, creatinine continuing to rise, now at 4.42 thought to be secondary to ATN Workup thus far negative including negative ANA's, ANCA, GBM, complement levels I discussed the case with Dr. Jonnie Finner with nephrology who is recommending transfer to [facility] for hemodialysis  Acute metabolic encephalopathy Patient notably confused, likely multifactorial secondary to infection and possibly superimposed uremia TSH, vitamin B12, ammonia, drug screen, VBG unremarkable on 1/4 CT head unremarkable on 1/3 Monitoring for spontaneous improvement with management of renal disease and treatment of infection  Alcohol abuse Patient was managed with CIWA protocol for the first several days of hospitalization and has since been discontinued.  Protein-calorie malnutrition, moderate (HCC) Nutrition following, their input is appreciated Boost nutritional supplements Assisting patient with all meals Daily multivitamin  Gouty arthritis of right great toe Identified 1/8 and confirmed via podiatry consultation at  that time with Dr. Blenda Mounts Patient had been placed on prednisone  40 mg daily as of 1/9  Nicotine dependence, cigarettes, uncomplicated Providing patient with daily nicotine patches     Subjective:  Patient is notably confused and is unable to  answer questions appropriately.  Physical Exam:  Vitals:   06/17/22 1810 06/17/22 1814 06/17/22 1830 06/17/22 1907  BP: (!) 89/71 105/75 103/79 114/85  Pulse:   98 (!) 110  Resp: (!) 32 (!) 37 (!) 22   Temp:  97.9 F (36.6 C)  97.8 F (36.6 C)  TempSrc:  Oral  Oral  SpO2: 92% 93% 98% 97%  Weight:  59.5 kg    Height:        Constitutional: Large but arousable, oriented x 1, not in any associated distress.   Skin: no rashes, no lesions, good skin turgor noted. Eyes: Pupils are equally reactive to light.  No evidence of scleral icterus or conjunctival pallor.  ENMT: Moist mucous membranes noted.  Posterior pharynx clear of any exudate or lesions.   Respiratory: clear to auscultation bilaterally, no wheezing, no crackles. Normal respiratory effort. No accessory muscle use.  Cardiovascular: Tachycardia cardiac rate with regular rhythm, No extremity edema. 2+ pedal pulses. No carotid bruits.  Abdomen: Markedly distended abdomen that is somewhat firm and nontender.  No evidence of intra-abdominal masses.  Hypoactive bowel sounds in all quadrants. Musculoskeletal: No joint deformity upper and lower extremities. Good ROM, no contractures. Normal muscle tone.    Data Reviewed:  I have personally reviewed and interpreted labs, imaging.  Significant findings are   CBC: Recent Labs  Lab 06/11/22 0902 06/12/22 0304 06/13/22 0245 06/14/22 0315 06/15/22 0244 06/16/22 0437 06/17/22 0426  WBC 3.1* 6.9 9.1 9.1 10.3 10.8* 12.4*  NEUTROABS 2.9 6.7  --   --   --   --   --   HGB 11.8* 11.1* 11.2* 11.0* 11.1* 10.3* 10.4*  HCT 34.0* 31.4* 31.9* 31.1* 32.5* 29.8* 29.7*  MCV 96.3 94.9 95.8 96.3 97.6 95.5 96.7  PLT 119* 95* 81* 83* 100* 164 474    Basic Metabolic Panel: Recent Labs  Lab 06/11/22 0902 06/12/22 0304 06/13/22 0245 06/13/22 0247 06/13/22 1609 06/14/22 0315 06/14/22 1534 06/15/22 0244 06/15/22 0247 06/15/22 1543 06/16/22 0437 06/17/22 0426  NA 137   < > 137   < > 138 138 136 136 135 140 138 140  K 4.8   < > 4.3   < > 4.4 4.3 3.9 3.9 3.9 3.7 3.9 4.2  CL 100   < > 101   < > 100 104 103 103 102 105 107 111  CO2 16*   < > 26   < > 26 26 25 24 24 25 22  18*  GLUCOSE 122*   < > 116*   < > 120* 101* 111* 105* 107* 117* 104* 123*  BUN 123*   < > 58*   < > 35* 31* 24* 21* 24* 22* 35* 55*  CREATININE 8.84*   < > 3.78*   < > 2.58* 2.16* 1.82* 1.77* 1.89* 2.06* 3.26* 4.42*  CALCIUM 8.0*   < > 7.8*   < > 8.2* 8.0* 7.8* 8.1* 8.0* 7.7* 7.8* 8.3*  MG 2.6*  --  2.5*  --   --  2.6*  --  2.8*  --   --  2.3  --   PHOS 6.4*   < >  --    < > 3.0  --  2.0*  --  1.8*  --  4.8* 6.9*   < > = values  in this interval not displayed.   GFR: Estimated Creatinine Clearance: 16.3 mL/min (A) (by C-G formula based on SCr of 4.42 mg/dL (H)). Liver Function Tests: Recent Labs  Lab 06/11/22 0902 06/12/22 0304 06/12/22 1527 06/13/22 0245 06/13/22 0247 06/13/22 1609 06/14/22 1534 06/15/22 0247 06/16/22 0437 06/17/22 0426  AST 104* 108*  --  66*  --   --   --   --   --   --   ALT 21 20  --  20  --   --   --   --   --   --   ALKPHOS 17* 19*  --  28*  --   --   --   --   --   --   BILITOT 1.8* 1.0  --  0.9  --   --   --   --   --   --   PROT 6.7 5.8*  --  5.7*  --   --   --   --   --   --   ALBUMIN 2.5* 2.1*   < > 2.0*   < > 2.2* 2.0* 2.0* 1.7* 1.8*   < > = values in this interval not displayed.    Coagulation Profile: Recent Labs  Lab 06/11/22 1027  INR 1.4*     EKG/Telemetry: Personally reviewed.  Rhythm is sinus tachycardia with heart rate of 100bpm.  No dynamic ST segment changes appreciated.   Code Status:  Full code.    Severity of Illness:  The appropriate patient status for this patient is INPATIENT. Inpatient  status is judged to be reasonable and necessary in order to provide the required intensity of service to ensure the patient's safety. The patient's presenting symptoms, physical exam findings, and initial radiographic and laboratory data in the context of their chronic comorbidities is felt to place them at high risk for further clinical deterioration. Furthermore, it is not anticipated that the patient will be medically stable for discharge from the hospital within 2 midnights of admission.   * I certify that at the point of admission it is my clinical judgment that the patient will require inpatient hospital care spanning beyond 2 midnights from the point of admission due to high intensity of service, high risk for further deterioration and high frequency of surveillance required.*  Time spent:  60 minutes  Author:  Vernelle Emerald MD  06/17/2022 7:46 PM

## 2022-06-17 NOTE — Assessment & Plan Note (Signed)
Patient was managed with CIWA protocol for the first several days of hospitalization and has since been discontinued.

## 2022-06-17 NOTE — Assessment & Plan Note (Addendum)
Patient notably confused, likely multifactorial secondary to infection and possibly superimposed uremia TSH, vitamin B12, ammonia, drug screen, VBG unremarkable on 1/4 CT head unremarkable on 1/3 Monitoring for spontaneous improvement with management of renal disease and treatment of infection

## 2022-06-17 NOTE — Assessment & Plan Note (Signed)
Improving, patient is been weaned to room air Remainder of assessment and plan as above

## 2022-06-17 NOTE — Progress Notes (Signed)
   06/17/22 1814  Vitals  Temp 97.9 F (36.6 C)  Temp Source Oral  BP 105/75  MAP (mmHg) 84  BP Location Right Arm  BP Method Automatic  Patient Position (if appropriate) Lying  Pulse Rate Source Monitor  ECG Heart Rate (!) 114  Resp (!) 37  Oxygen Therapy  SpO2 93 %  O2 Device Room Air  Pulse Oximetry Type Continuous   Received patient in bed to unit.  Alert and oriented.  Informed consent signed and in chart.   Treatment initiated: 4163 Treatment completed: 1810  Patient tolerated well.  Transported back to the room  Alert, without acute distress.  Hand-off given to patient's nurse.   Access used: HD cath Access issues: NA  Total UF removed: 3034ml Medication(s) given: Heparin 2500 units bolus, Heparin Dwells 2400 units  Post HD VS: see above Post HD weight: 59.5kg   [facility]

## 2022-06-17 NOTE — Assessment & Plan Note (Signed)
Patient initially presented with creatinine of 7.74 with initial substantial improvement.   Patient received CRRT from 1/5 until 1/8 through right IJ hemodialysis catheter Now, creatinine continuing to rise, now at 4.42 thought to be secondary to ATN Workup thus far negative including negative ANA's, ANCA, GBM, complement levels I discussed the case with Dr. Jonnie Finner with nephrology who is recommending transfer to [facility] for hemodialysis

## 2022-06-17 NOTE — Hospital Course (Addendum)
52 year old male with past medical history of alcoholism presenting to [facility] emergency department with abdominal pain and shortness of breath.  Upon evaluation in the emergency department patient was found to be extremely short of breath and tachypneic with multiple SIRS criteria for which the patient was initially placed on high flow oxygen.  Workup revealed acute kidney injury with creatinine of 7.74 as well as right middle lobe and bibasilar pneumonia on chest imaging concerning for sepsis.  Patient was placed on intravenous fluids and broad-spectrum intravenous antibiotics and the hospitalist group was then called to assess the patient for admission to the hospital.  Patient's creatinine initially improved with the guidance of Dr. Jonnie Finner with nephrology via consultation.  Patient did receive CRRT from 1/5 until 1/8 through a right IJ hemodialysis catheter.  Unfortunately, creatinine began to increase bringing about concern for ATN.  Workup included ANA/ANCA/GBM/complement levels all of which were unremarkable.    Concerning patient's sepsis and pneumonia, patient was initially treated with broad-spectrum antibiotics.   Blood cultures obtained and 1/4 revealed Streptococcus pneumoniae bacteremia.  Antibiotics were then de-escalated to ampicillin.  However echocardiogram ( EF 60-65% , grade I diastolic dysfunction) on 1/8 revealed concern for endocarditis on the anterior leaflet of the mitral valve.  Dr. Doylene Canard with cardiology was then consulted for consideration of TEE.  Patient appeared to develop right great toe gout on 1/8 for which podiatry was consulted.  Patient has been managed with prednisone 40 mg daily started on 1/9.  On 1/10 due to rising creatinine patient is being transferred to [facility] at the recommendation of Dr. Jonnie Finner nephrology for initiation of hemodialysis.

## 2022-06-17 NOTE — Progress Notes (Signed)
HOSPITAL MEDICINE EVENT NOTE    Patient has successfully been transferred to [facility] has received a session of hemodialysis this evening without complication.  I have also reviewed the results of the abdominal x-ray ordered earlier in the day due to a distended abdomen I noticed during examination earlier.  Abdominal x-ray appears to be consistent with an ileus however per my discussion with bedside nursing patient is not complaining of any abdominal pain nor is he vomiting.  Will keep patient n.p.o. overnight, order repeat abdominal x-ray in the morning which can be reassessed by the daytime provider at that time and determination can be made as to whether a diet can be initiated, NG tube needs to be placed or surgical consultation is needed.  Finally, upon review of the chart I am noticing there is no note to indicate patient did undergo his TEE earlier today.  This may have been postponed due to the need for transfer.  Will need to coordinate with cardiology in the morning.  Vernelle Emerald  MD [facility]

## 2022-06-17 NOTE — Progress Notes (Signed)
[facility] Progress Note  Subjective: UOP 160 yest and 75 today so far. Creat up to mid 4s. Last CVP 4.   Vitals:   06/17/22 0430 06/17/22 0500 06/17/22 0600 06/17/22 0700  BP: (!) 176/102 (!) 169/98 (!) 171/108 (!) 162/101  Pulse: 81 81 87 90  Resp: (!) 23 (!) 26 (!) 34 (!) 31  Temp:      TempSrc:      SpO2: 96% 95% 95% 96%  Weight:      Height:        Exam: General adult male in bed Lungs breathing comfortably; on RA Heart S1s2 no rub tachycardic Abdomen soft, no ascites Extremities no edema lower extremities  Neuro answers simple questions Access RIJ nontunneled catheter in place    UA cloudy, large Hb, 100 prot, >50rbc, 21-50 wbc/ epis  UNa 69, UCr 101   CXR - 1/08, no edema, R basilar process  Assessment/ Plan: AKI - suspect secondary to prolonged pre-renal insults leading to ATN in setting of alcohol abuse and severe pneumonia.Marland Kitchen required pressors on two different occasions for 48 hrs or less. Now is normotensive. 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  Serologies were all wnl (ANA, ANCA, GBM and C3/C4). Patient got CRRT 1/05 - 1/08.  IVF"s started 1/08 for low CVPs. Still low CVPs, cont IVF. UOP 160 cc yest, creat rising. May need iHD in the next couple of days.  Severe multifocal pneumonia/ +strep pneumoniae bacteremia - antibiotics per primary team.   Acute hypoxic respiratory failure - is off O2 now AMS - multifactorial w/ AKI, alcohol abuse  Alcohol abuse/ ^LFT's - withdrawal protocol per primary team   James [name] 06/17/2022, 8:42 AM   Recent Labs  Lab 06/16/22 0437 06/17/22 0426  HGB 10.3* 10.4*  ALBUMIN 1.7* 1.8*  CALCIUM 7.8* 8.3*  PHOS 4.8* 6.9*  CREATININE 3.26* 4.42*  K 3.9 4.2    No results for input(s): "IRON", "TIBC", "FERRITIN" in the last 168 hours. Inpatient medications:  Chlorhexidine Gluconate Cloth  6 each Topical Q2200   docusate sodium  100 mg Oral BID   feeding supplement  1 Container Oral TID  BM   folic acid  1 mg Intravenous Daily   heparin  5,000 Units Subcutaneous Q8H   lactulose  20 g Oral Daily   multivitamin with minerals  1 tablet Oral Daily   nicotine  21 mg Transdermal Daily   mouth rinse  15 mL Mouth Rinse 4 times per day   pantoprazole (PROTONIX) IV  40 mg Intravenous Q12H   polyethylene glycol  17 g Oral Daily   predniSONE  40 mg Oral Q breakfast    ampicillin (OMNIPEN) IV Stopped (06/17/22 0135)   lactated ringers     thiamine (VITAMIN B1) injection Stopped (06/16/22 1035)   acetaminophen **OR** acetaminophen, fentaNYL (SUBLIMAZE) injection, LORazepam, ondansetron **OR** ondansetron (ZOFRAN) IV, mouth rinse

## 2022-06-17 NOTE — Anesthesia Preprocedure Evaluation (Deleted)
Anesthesia Evaluation  Patient identified by MRN, date of birth, ID band Patient awake    Reviewed: Allergy & Precautions, NPO status , Patient's Chart, lab work & pertinent test results  Airway        Dental   Pulmonary pneumonia, Current Smoker          Cardiovascular   TTE 06/15/2022: IMPRESSIONS     1. There is shagginess of the anterior leaflet of the mitral valve,  ventricular surface. Consider TEE for further evaluation. The mitral valve  is grossly normal. No evidence of mitral valve regurgitation. No evidence  of mitral stenosis.   2. Left ventricular ejection fraction, by estimation, is 60 to 65%. The  left ventricle has normal function. The left ventricle has no regional  wall motion abnormalities. Left ventricular diastolic parameters are  consistent with Grade I diastolic  dysfunction (impaired relaxation).   3. Right ventricular systolic function is normal. The right ventricular  size is normal.   4. The aortic valve is normal in structure. Aortic valve regurgitation is  not visualized. No aortic stenosis is present.   5. The inferior vena cava is normal in size with greater than 50%  respiratory variability, suggesting right atrial pressure of 3 mmHg.     Neuro/Psych    GI/Hepatic ,,,(+)     substance abuse  alcohol useTransaminitis pancreatitis   Endo/Other    Renal/GU ARFRenal disease     Musculoskeletal   Abdominal   Peds  Hematology   Anesthesia Other Findings bacteremia  Reproductive/Obstetrics                             Anesthesia Physical Anesthesia Plan  ASA: 4  Anesthesia Plan: MAC   Post-op Pain Management:    Induction: Intravenous  PONV Risk Score and Plan: Propofol infusion  Airway Management Planned: Natural Airway and Nasal Cannula  Additional Equipment:   Intra-op Plan:   Post-operative Plan:   Informed Consent:      Dental  advisory given  Plan Discussed with: CRNA and Anesthesiologist  Anesthesia Plan Comments: (Patient was on CRRT 06/12/22-06/15/22. Cr has gone from 1.89 to 4.42 since stopping dialysis. He is tachypneic (upper 30s) and subjectively feels SOB. His abdomen is distended and tight. He says he feels bloated, like he has too much fluid on him. Recommend he be evaluated for dialysis prior to TEE.)        Anesthesia Quick Evaluation

## 2022-06-17 NOTE — Assessment & Plan Note (Signed)
Persisting leukocytosis with bouts of tachycardia Continuing intravenous ampicillin to complete at least 14 days of therapy Strep pneumoniae bacteremia based on blood cultures 1/4.   Blood cultures 1/8 revealed no growth at this time. Echocardiogram 1/8 revealing possible vegetation on anterior leaflet of mitral valve, patient to undergo TEE today

## 2022-06-17 NOTE — Assessment & Plan Note (Signed)
Providing patient with daily nicotine patches

## 2022-06-17 NOTE — Assessment & Plan Note (Signed)
Identified 1/8 and confirmed via podiatry consultation at that time with Dr. Blenda Mounts Patient had been placed on prednisone 40 mg daily as of 1/9

## 2022-06-18 ENCOUNTER — Encounter (HOSPITAL_COMMUNITY)
Admission: EM | Disposition: A | Discharge status: Home Health | Payer: Self-pay | Source: Home / Self Care | Attending: Internal Medicine

## 2022-06-18 ENCOUNTER — Encounter (HOSPITAL_COMMUNITY): Payer: Self-pay | Admitting: Internal Medicine

## 2022-06-18 ENCOUNTER — Inpatient Hospital Stay (HOSPITAL_COMMUNITY): Payer: BLUE CROSS/BLUE SHIELD

## 2022-06-18 ENCOUNTER — Inpatient Hospital Stay (HOSPITAL_COMMUNITY): Payer: BLUE CROSS/BLUE SHIELD | Admitting: Certified Registered"

## 2022-06-18 DIAGNOSIS — J9601 Acute respiratory failure with hypoxia: Secondary | ICD-10-CM | POA: Diagnosis not present

## 2022-06-18 HISTORY — PX: BUBBLE STUDY: SHX6837

## 2022-06-18 HISTORY — PX: TEE WITHOUT CARDIOVERSION: SHX5443

## 2022-06-18 LAB — RENAL FUNCTION PANEL
Albumin: 1.8 g/dL — ABNORMAL LOW (ref 3.5–5.0)
Anion gap: 12 (ref 5–15)
BUN: 37 mg/dL — ABNORMAL HIGH (ref 6–20)
CO2: 26 mmol/L (ref 22–32)
Calcium: 8.1 mg/dL — ABNORMAL LOW (ref 8.9–10.3)
Chloride: 101 mmol/L (ref 98–111)
Creatinine, Ser: 3.42 mg/dL — ABNORMAL HIGH (ref 0.61–1.24)
GFR, Estimated: 21 mL/min — ABNORMAL LOW (ref >60)
Glucose, Bld: 99 mg/dL (ref 70–99)
Phosphorus: 6.1 mg/dL — ABNORMAL HIGH (ref 2.5–4.6)
Potassium: 3.6 mmol/L (ref 3.5–5.1)
Sodium: 139 mmol/L (ref 135–145)

## 2022-06-18 LAB — GLUCOSE, CAPILLARY: Glucose-Capillary: 110 mg/dL — ABNORMAL HIGH (ref 70–99)

## 2022-06-18 LAB — HEPATITIS B SURFACE ANTIBODY, QUANTITATIVE: Hep B S AB Quant (Post): <3.1 m[IU]/mL — ABNORMAL LOW (ref >9.9)

## 2022-06-18 SURGERY — ECHOCARDIOGRAM, TRANSESOPHAGEAL
Anesthesia: General

## 2022-06-18 MED ORDER — PROPOFOL 10 MG/ML IV BOLUS
INTRAVENOUS | Status: DC | PRN
  Administered 2022-06-18: 70 mg via INTRAVENOUS

## 2022-06-18 MED ORDER — SUCCINYLCHOLINE CHLORIDE 200 MG/10ML IV SOSY
PREFILLED_SYRINGE | INTRAVENOUS | Status: DC | PRN
  Administered 2022-06-18: 80 mg via INTRAVENOUS

## 2022-06-18 MED ORDER — METOCLOPRAMIDE HCL 5 MG/ML IJ SOLN
5.0000 mg | Freq: Three times a day (TID) | INTRAMUSCULAR | Status: AC
  Administered 2022-06-18 – 2022-06-21 (×9): 5 mg via INTRAVENOUS
  Filled 2022-06-18 (×9): qty 2

## 2022-06-18 MED ORDER — SODIUM CHLORIDE 0.9 % IV SOLN: INTRAVENOUS | Status: DC | PRN

## 2022-06-18 MED ORDER — PHENYLEPHRINE 80 MCG/ML (10ML) SYRINGE FOR IV PUSH (FOR BLOOD PRESSURE SUPPORT)
PREFILLED_SYRINGE | INTRAVENOUS | Status: DC | PRN
  Administered 2022-06-18 (×2): 160 ug via INTRAVENOUS
  Administered 2022-06-18: 80 ug via INTRAVENOUS

## 2022-06-18 MED ORDER — ONDANSETRON HCL 4 MG/2ML IJ SOLN
INTRAMUSCULAR | Status: DC | PRN
  Administered 2022-06-18: 4 mg via INTRAVENOUS

## 2022-06-18 MED ORDER — LIDOCAINE 2% (20 MG/ML) 5 ML SYRINGE
INTRAMUSCULAR | Status: DC | PRN
  Administered 2022-06-18: 60 mg via INTRAVENOUS

## 2022-06-18 MED ORDER — FENTANYL CITRATE (PF) 100 MCG/2ML IJ SOLN
INTRAMUSCULAR | Status: AC
  Filled 2022-06-18: qty 2

## 2022-06-18 MED ORDER — SODIUM CHLORIDE 0.9 % IV SOLN: INTRAVENOUS | Status: DC

## 2022-06-18 NOTE — Anesthesia Procedure Notes (Signed)
Procedure Name: Intubation Date/Time: 06/18/2022 2:01 PM  Performed by: Gaylene Brooks, CRNAPre-anesthesia Checklist: Patient identified, Emergency Drugs available, Suction available and Patient being monitored Patient Re-evaluated:Patient Re-evaluated prior to induction Oxygen Delivery Method: Circle System Utilized Preoxygenation: Pre-oxygenation with 100% oxygen Induction Type: IV induction, Rapid sequence and Cricoid Pressure applied Laryngoscope Size: Miller and 2 Grade View: Grade II Tube type: Oral Tube size: 7.5 mm Number of attempts: 1 Airway Equipment and Method: Stylet and Oral airway Placement Confirmation: ETT inserted through vocal cords under direct vision, positive ETCO2 and breath sounds checked- equal and bilateral Secured at: 23 cm Tube secured with: Tape Dental Injury: Teeth and Oropharynx as per pre-operative assessment

## 2022-06-18 NOTE — Progress Notes (Signed)
  Echocardiogram Echocardiogram Transesophageal has been performed.  James [name] 06/18/2022, 2:43 PM

## 2022-06-18 NOTE — Anesthesia Preprocedure Evaluation (Addendum)
Anesthesia Evaluation  Patient identified by MRN, date of birth, ID band Patient awake    Reviewed: Allergy & Precautions, NPO status , Patient's Chart, lab work & pertinent test results  Airway Mallampati: II  TM Distance: >3 FB Neck ROM: Full    Dental no notable dental hx.    Pulmonary Current Smoker and Patient abstained from smoking.   Pulmonary exam normal breath sounds clear to auscultation       Cardiovascular hypertension, Pt. on medications Normal cardiovascular exam Rhythm:Regular Rate:Normal  Echo:  1. There is shagginess of the anterior leaflet of the mitral valve,  ventricular surface. Consider TEE for further evaluation. The mitral valve  is grossly normal. No evidence of mitral valve regurgitation. No evidence  of mitral stenosis.   2. Left ventricular ejection fraction, by estimation, is 60 to 65%. The  left ventricle has normal function. The left ventricle has no regional  wall motion abnormalities. Left ventricular diastolic parameters are  consistent with Grade I diastolic  dysfunction (impaired relaxation).   3. Right ventricular systolic function is normal. The right ventricular  size is normal.   4. The aortic valve is normal in structure. Aortic valve regurgitation is  not visualized. No aortic stenosis is present.   5. The inferior vena cava is normal in size with greater than 50%  respiratory variability, suggesting right atrial pressure of 3 mmHg.     Neuro/Psych negative neurological ROS  negative psych ROS   GI/Hepatic ,GERD  Medicated,,(+)     substance abuse  alcohol use  Endo/Other    Renal/GU ESRF and DialysisRenal disease     Musculoskeletal  (+) Arthritis ,    Abdominal   Peds  Hematology   Anesthesia Other Findings   Reproductive/Obstetrics                             Anesthesia Physical Anesthesia Plan  ASA: 3  Anesthesia Plan: General    Post-op Pain Management: Minimal or no pain anticipated   Induction: Rapid sequence, Cricoid pressure planned and Intravenous  PONV Risk Score and Plan: 1 and Ondansetron and Treatment may vary due to age or medical condition  Airway Management Planned: Oral ETT  Additional Equipment: None  Intra-op Plan:   Post-operative Plan: Extubation in OR  Informed Consent: I have reviewed the patients History and Physical, chart, labs and discussed the procedure including the risks, benefits and alternatives for the proposed anesthesia with the patient or authorized representative who has indicated his/her understanding and acceptance.     Dental advisory given and Consent reviewed with POA  Plan Discussed with: CRNA  Anesthesia Plan Comments: (Discussed with daughter, Lieutenant Diego, high risk for aspiration. Dr. Doylene Canard wishes to proceed. Daughter consents.   NGT to placed prior to emergence.   )       Anesthesia Quick Evaluation

## 2022-06-18 NOTE — CV Procedure (Signed)
INDICATIONS:   The patient is 52 years old black male with pneumonia and bacteremia.Marland Kitchen  PROCEDURE:  Informed consent was discussed including risks, benefits and alternatives for the procedure.  Risks include, but are not limited to, cough, sore throat, vomiting, nausea, somnolence, esophageal and stomach trauma or perforation, bleeding, low blood pressure, aspiration, pneumonia, infection, trauma to the teeth and death.    Patient was given general anesthesia and intubated. The oropharynx was anesthetized with topical lidocaine.  The transesophageal probe was inserted in the esophagus and stomach and multiple views were obtained.  Agitated saline was used after the transesophageal probe was removed from the body.  The patient was kept under observation until the patient left the procedure room.  The patient left the procedure room in stable condition.   COMPLICATIONS:  There were no immediate complications.  FINDINGS:  LEFT VENTRICLE: The left ventricle is normal in structure and function.  Wall motion is normal.  No thrombus or masses seen in the left ventricle.  RIGHT VENTRICLE:  The right ventricle is normal in structure and function without any thrombus or masses.    LEFT ATRIUM:  The left atrium is normal without any thrombus or masses.  LEFT ATRIAL APPENDAGE:  The left atrial appendage is free of any thrombus or masses.  RIGHT ATRIUM:  The right atrium is free of any thrombus or masses.    ATRIAL SEPTUM:  The atrial septum is normal without any ASD or PFO. Negative sonicated saline injection for PFO.  MITRAL VALVE:  The mitral valve is normal in structure and function without significant regurgitation, masses, stenosis or vegetations.  TRICUSPID VALVE:  The tricuspid valve is normal in structure and function without significant regurgitation, no masses, stenosis or vegetations.  AORTIC VALVE:  The aortic valve is normal in structure and function without regurgitation, masses, stenosis  or vegetations.  PULMONIC VALVE:  The pulmonic valve is normal in structure and function without regurgitation, masses, stenosis or vegetations.  AORTIC ARCH, ASCENDING AND DESCENDING AORTA:  The aorta had minimal atherosclerosis in the ascending or descending aorta.  The aortic arch was normal.   Superior Vena Cava : No thrombus or catheter.   Pulmonary Veins: Visible.   Pulmonary artery: visible and normal.   IMPRESSION:   Normal LV systolic function. No MV, TV, PV or AV vegetation. No PFO.Marland Kitchen  RECOMMENDATIONS:    Medical treatment.Marland Kitchen

## 2022-06-18 NOTE — Anesthesia Postprocedure Evaluation (Signed)
Anesthesia Post Note  Patient: James [name]  Procedure(s) Performed: TRANSESOPHAGEAL ECHOCARDIOGRAM (TEE) BUBBLE STUDY     Patient location during evaluation: PACU Anesthesia Type: General Level of consciousness: sedated and patient cooperative Pain management: pain level controlled Vital Signs Assessment: post-procedure vital signs reviewed and stable Respiratory status: spontaneous breathing Cardiovascular status: stable Anesthetic complications: no   No notable events documented.  Last Vitals:  Vitals:   06/18/22 1510 06/18/22 1649  BP: 112/74 106/75  Pulse: 92 97  Resp: (!) 31 18  Temp:  37.2 C  SpO2: 100% 98%    Last Pain:  Vitals:   06/18/22 1500  TempSrc:   PainSc: 0-No pain                 Nolon Nations

## 2022-06-18 NOTE — Transfer of Care (Signed)
Immediate Anesthesia Transfer of Care Note  Patient: James [name]  Procedure(s) Performed: TRANSESOPHAGEAL ECHOCARDIOGRAM (TEE) BUBBLE STUDY  Patient Location: Endoscopy Unit  Anesthesia Type:General  Level of Consciousness: drowsy and patient cooperative  Airway & Oxygen Therapy: Patient Spontanous Breathing and Patient connected to face mask oxygen  Post-op Assessment: Report given to RN, Post -op Vital signs reviewed and stable, and Patient moving all extremities X 4  Post vital signs: Reviewed and stable  Last Vitals:  Vitals Value Taken Time  BP    Temp    Pulse    Resp    SpO2      Last Pain:  Vitals:   06/18/22 1310  TempSrc: Temporal  PainSc: 0-No pain         Complications: No notable events documented.

## 2022-06-18 NOTE — Progress Notes (Signed)
[facility] Progress Note  Subjective: 3 L off w/ HD yesterday. SOB much better.   Vitals:   06/17/22 2203 06/18/22 0201 06/18/22 0511 06/18/22 0910  BP: 121/83 117/81 125/79 118/80  Pulse: 100 91 90 92  Resp: 18 16 18 18   Temp: 98.6 F (37 C) 98.6 F (37 C) 98 F (36.7 C) 98.2 F (36.8 C)  TempSrc: Oral Oral Oral Oral  SpO2: 97% 98% 99% 99%  Weight:   59.8 kg   Height:        Exam: General adult male in bed Lungs breathing comfortably; on RA Heart S1s2 no rub tachycardic Abdomen soft, no ascites Extremities no edema lower extremities  Neuro more oriented and responsive today Access RIJ nontunneled catheter in place    UA cloudy, large Hb, 100 prot, >50rbc, 21-50 wbc/ epis  UNa 69, UCr 101   CXR - 1/08, no edema, R basilar process  Assessment/ Plan: AKI - suspect secondary to prolonged pre-renal insults leading to ATN in setting of alcohol abuse and severe pneumonia.Marland Kitchen required pressors on two different occasions for 48 hrs or less. Now is normotensive. 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  Serologies were all wnl (ANA, ANCA, GBM and C3/C4). Patient got CRRT 1/05 - 1/08.  IVF"s started 1/08 for low CVPs and looked dry. Yest pt sent to [facility] and had HD. UOP is improving up to 450 yest and 600 cc so far today. Watch labs, hold off on HD for now. Will follow.  Acute resp failure/ vol overload - 3 L UF yest and looks better today. Severe multifocal pneumonia/ +strep pneumoniae bacteremia - antibiotics per primary team.   AMS - multifactorial w/ AKI, alcohol abuse  Alcohol abuse/ ^LFT's - withdrawal protocol per primary team   Rob Danyle Boening 06/18/2022, 12:19 PM   Recent Labs  Lab 06/16/22 0437 06/17/22 0426 06/18/22 0334  HGB 10.3* 10.4*  --   ALBUMIN 1.7* 1.8* 1.8*  CALCIUM 7.8* 8.3* 8.1*  PHOS 4.8* 6.9* 6.1*  CREATININE 3.26* 4.42* 3.42*  K 3.9 4.2 3.6    No results for input(s): "IRON", "TIBC", "FERRITIN" in the last 168  hours. Inpatient medications:  Chlorhexidine Gluconate Cloth  6 each Topical Q2200   Chlorhexidine Gluconate Cloth  6 each Topical Q0600   docusate sodium  100 mg Oral BID   feeding supplement  1 Container Oral TID BM   folic acid  1 mg Intravenous Daily   heparin  5,000 Units Subcutaneous Q8H   lactulose  20 g Oral Daily   multivitamin with minerals  1 tablet Oral Daily   nicotine  21 mg Transdermal Daily   mouth rinse  15 mL Mouth Rinse 4 times per day   pantoprazole  40 mg Oral Daily   polyethylene glycol  17 g Oral Daily   predniSONE  40 mg Oral Q breakfast    ampicillin (OMNIPEN) IV 2 g (06/18/22 1122)   thiamine (VITAMIN B1) injection 500 mg (06/18/22 1130)   acetaminophen **OR** acetaminophen, fentaNYL (SUBLIMAZE) injection, hydrALAZINE, LORazepam, ondansetron **OR** ondansetron (ZOFRAN) IV, mouth rinse

## 2022-06-18 NOTE — Consult Note (Signed)
Ref: Patient, No Pcp Per   Subjective:  Awake. Decreasing shortness of breath. VS stable. He had over 2 L neg fluid balance with dialysis. NPO for TEE today.  Objective:  Vital Signs in the last 24 hours: Temp:  [97.4 F (36.3 C)-98.6 F (37 C)] 98.2 F (36.8 C) (01/11 0910) Pulse Rate:  [89-119] 92 (01/11 0910) Cardiac Rhythm: Normal sinus rhythm (01/11 0700) Resp:  [16-41] 18 (01/11 0910) BP: (89-151)/(71-105) 118/80 (01/11 0910) SpO2:  [91 %-100 %] 99 % (01/11 0910) Weight:  [59.5 kg-62.1 kg] 59.8 kg (01/11 0511)  Physical Exam: BP Readings from Last 1 Encounters:  06/18/22 118/80     Wt Readings from Last 1 Encounters:  06/18/22 59.8 kg    Weight change: 3.7 kg Body mass index is 20.05 kg/m. HEENT: [place]/AT, Eyes-Brown, Conjunctiva-Pink, Sclera-Non-icteric Neck: No JVD, No bruit, Trachea midline. Lungs:  Clearing, Bilateral. Cardiac:  Regular rhythm, normal S1 and S2, no S3. II/VI systolic murmur. Abdomen:  Soft, non-tender. BS present. Extremities:  No edema present. No cyanosis. No clubbing. CNS: AxOx3, Cranial nerves grossly intact, moves all 4 extremities.  Skin: Warm and dry.   Intake/Output from previous day: 01/10 0701 - 01/11 0700 In: 1058.7 [P.O.:240; I.V.:818.7] Out: 3775 [Urine:775]    Lab Results: BMET    Component Value Date/Time   NA 139 06/18/2022 0334   NA 140 06/17/2022 0426   NA 138 06/16/2022 0437   K 3.6 06/18/2022 0334   K 4.2 06/17/2022 0426   K 3.9 06/16/2022 0437   CL 101 06/18/2022 0334   CL 111 06/17/2022 0426   CL 107 06/16/2022 0437   CO2 26 06/18/2022 0334   CO2 18 (L) 06/17/2022 0426   CO2 22 06/16/2022 0437   GLUCOSE 99 06/18/2022 0334   GLUCOSE 123 (H) 06/17/2022 0426   GLUCOSE 104 (H) 06/16/2022 0437   BUN 37 (H) 06/18/2022 0334   BUN 55 (H) 06/17/2022 0426   BUN 35 (H) 06/16/2022 0437   CREATININE 3.42 (H) 06/18/2022 0334   CREATININE 4.42 (H) 06/17/2022 0426   CREATININE 3.26 (H) 06/16/2022 0437   CALCIUM 8.1  (L) 06/18/2022 0334   CALCIUM 8.3 (L) 06/17/2022 0426   CALCIUM 7.8 (L) 06/16/2022 0437   GFRNONAA 21 (L) 06/18/2022 0334   GFRNONAA 15 (L) 06/17/2022 0426   GFRNONAA 22 (L) 06/16/2022 0437   GFRAA  02/12/2010 0949    >60        The eGFR has been calculated using the MDRD equation. This calculation has not been validated in all clinical situations. eGFR's persistently <60 mL/min signify possible Chronic Kidney Disease.   CBC    Component Value Date/Time   WBC 12.4 (H) 06/17/2022 0426   RBC 3.07 (L) 06/17/2022 0426   HGB 10.4 (L) 06/17/2022 0426   HCT 29.7 (L) 06/17/2022 0426   PLT 241 06/17/2022 0426   MCV 96.7 06/17/2022 0426   MCH 33.9 06/17/2022 0426   MCHC 35.0 06/17/2022 0426   RDW 15.8 (H) 06/17/2022 0426   LYMPHSABS 0.1 (L) 06/12/2022 0304   MONOABS 0.1 06/12/2022 0304   EOSABS 0.0 06/12/2022 0304   BASOSABS 0.0 06/12/2022 0304   HEPATIC Function Panel Recent Labs    06/11/22 0902 06/12/22 0304 06/12/22 1527 06/13/22 0245 06/13/22 0247 06/16/22 0437 06/17/22 0426 06/18/22 0334  PROT 6.7 5.8*  --  5.7*  --   --   --   --   ALBUMIN 2.5* 2.1*   < > 2.0*   < >  1.7* 1.8* 1.8*  AST 104* 108*  --  66*  --   --   --   --   ALT 21 20  --  20  --   --   --   --   ALKPHOS 17* 19*  --  28*  --   --   --   --    < > = values in this interval not displayed.   HEMOGLOBIN A1C Lab Results  Component Value Date   MPG 111 06/11/2022   CARDIAC ENZYMES No results found for: "CKTOTAL", "CKMB", "CKMBINDEX", "TROPONINI" BNP No results for input(s): "PROBNP" in the last 8760 hours. TSH Recent Labs    06/11/22 0902  TSH 0.872   CHOLESTEROL No results for input(s): "CHOL" in the last 8760 hours.  Scheduled Meds:  Chlorhexidine Gluconate Cloth  6 each Topical Q2200   Chlorhexidine Gluconate Cloth  6 each Topical Q0600   docusate sodium  100 mg Oral BID   feeding supplement  1 Container Oral TID BM   folic acid  1 mg Intravenous Daily   heparin  5,000 Units  Subcutaneous Q8H   lactulose  20 g Oral Daily   multivitamin with minerals  1 tablet Oral Daily   nicotine  21 mg Transdermal Daily   mouth rinse  15 mL Mouth Rinse 4 times per day   pantoprazole  40 mg Oral Daily   polyethylene glycol  17 g Oral Daily   predniSONE  40 mg Oral Q breakfast   Continuous Infusions:  ampicillin (OMNIPEN) IV 2 g (06/18/22 0200)   thiamine (VITAMIN B1) injection Stopped (06/16/22 1035)   PRN Meds:.acetaminophen **OR** acetaminophen, fentaNYL (SUBLIMAZE) injection, hydrALAZINE, LORazepam, ondansetron **OR** ondansetron (ZOFRAN) IV, mouth rinse  Assessment/Plan: Bacteremia r/o endocarditis Multifocal pneumonia Alcohol use disorder Tobacco use disorder Gout Acute on chronic kidney disease  Plan: TEE today.   LOS: 7 days   Time spent including chart review, lab review, examination, discussion with patient/Anesthesia/Primary/Endo-tech : 30 min   Dixie Dials  MD  06/18/2022, 11:17 AM

## 2022-06-18 NOTE — Progress Notes (Signed)
PROGRESS NOTE  James [name]  DOB: 1970-06-30  PCP: Patient, No Pcp Per CHE:[id number]  DOA: 06/10/2022  LOS: 7 days  Hospital Day: 9  Brief narrative: James [name] is a 52 y.o. male with PMH significant for chronic alcoholism, chronic smoking, pancreatitis. 1/3, patient presented to [facility] ED with complaint of abdominal pain, shortness of breath In the ED, patient was found to be extremely short of breath, tachypneic, requiring high flow oxygen.   Workup revealed creatinine significantly elevated to 7.74 Chest x-ray showed right middle lobe and bibasilar pneumonia Patient was started on IV antibiotics, IV fluid for sepsis Admitted to [facility] Nephrology consultation was obtained. Workup included ANA/ANCA/GBM/complement levels all of which were unremarkable.   1/5, patient was started on CRRT 1/8, right IJ hemodialysis catheter was placed 1/10, transferred to [facility] for maintenance hemodialysis  Of note, blood cultures sent on admission showed Streptococcus pneumonia bacteremia in 1 out of 4 bottles TTE 1/8 was concerning for endocarditis on the anterior leaflet of mitral valve.  Cardiology was consulted for consideration of TEE See below for details  Subjective: Patient was seen and examined this afternoon. Underwent TEE earlier.  No vegetation. Has NG tube attached to suction.  Has abdominal distention, mild tenderness.  Bowel sound present.  Reports last bowel movement this morning. 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   Blood cultures sent on admission showed Streptococcus pneumonia bacteremia in 1 out of 4  bottles TTE 1/8 was concerning for endocarditis on the anterior leaflet of mitral valve.  Cardiology was consulted for consideration of TEE Currently on IV antibiotics Recent Labs  Lab 06/12/22 0304 06/13/22 0245 06/14/22 0315 06/15/22 0244 06/16/22 0437 06/17/22 0426  WBC 6.9 9.1 9.1 10.3 10.8* 12.4*  PROCALCITON >150.00 >150.00  --   --   --   --    Acute kidney injury (AKI) Presented with creatinine significantly elevated to 7.74  1/5, patient was started on CRRT 1/8, right IJ hemodialysis catheter was placed 1/10, transferred to [facility] for maintenance hemodialysis Workup thus far negative including negative ANA's, ANCA, GBM, complement levels Nephrology following Recent Labs    06/13/22 0247 06/13/22 1609 06/14/22 0315 06/14/22 1534 06/15/22 0244 06/15/22 0247 06/15/22 1543 06/16/22 0437 06/17/22 0426 06/18/22 0334  BUN 57* 35* 31* 24* 21* 24* 22* 35* 55* 37*  CREATININE 3.89* 2.58* 2.16* 1.82* 1.77* 1.89* 2.06* 3.26* 4.42* 5.36*   Acute metabolic encephalopathy multifactorial secondary to infection, uremia TSH, vitamin B12, ammonia, drug screen, VBG unremarkable on 1/4 CT head 1/3 unremarkable  Monitoring for spontaneous improvement with management of renal disease and treatment of infection   Alcohol abuse Patient was managed with CIWA protocol for the first several days of hospitalization and has since been discontinued.   Protein-calorie malnutrition, moderate  Nutrition following, their input is appreciated Boost nutritional supplements Assisting patient with all meals Daily multivitamin   Gouty arthritis of right great toe Identified 1/8 and confirmed via podiatry consultation at that time with Dr. Blenda Mounts Patient had been placed on prednisone 40 mg daily as of 1/9   Nicotine dependence, cigarettes, uncomplicated Providing patient with daily nicotine patches   Abdominal ileus X-ray abdomen 1/10  and 1/11 showed colonic dilatation with persistent  gaseous distention of the intestinal tract. Findings most compatible with ileus.  Currently has NG tube attached to suction.  Patient states he had a bowel movement this morning.  Unable to verify from nursing staff. Will keep NG tube unclamped.  Start clear liquid diet.  Started scheduled Reglan for next 2 days.  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   Mobility: Encourage ambulation  Goals of care   Code Status: Full Code    Scheduled Meds:  Chlorhexidine Gluconate Cloth  6 each Topical Q2200   Chlorhexidine Gluconate Cloth  6 each Topical Q0600   docusate sodium  100 mg Oral BID   feeding supplement  1 Container Oral TID BM   folic acid  1 mg Intravenous Daily   heparin  5,000 Units Subcutaneous Q8H   lactulose  20 g Oral Daily   metoCLOPramide (REGLAN) injection  5 mg Intravenous Q8H   multivitamin with minerals  1 tablet Oral Daily   nicotine  21 mg Transdermal Daily   mouth rinse  15 mL Mouth Rinse 4 times per day   pantoprazole  40 mg Oral Daily   polyethylene glycol  17 g Oral Daily   predniSONE  40 mg Oral Q breakfast    PRN meds: acetaminophen **OR** acetaminophen, fentaNYL (SUBLIMAZE) injection, hydrALAZINE, LORazepam, ondansetron **OR** ondansetron (ZOFRAN) IV, mouth rinse   Infusions:   ampicillin (OMNIPEN) IV 2 g (06/18/22 1122)   thiamine (VITAMIN B1) injection 500 mg (06/18/22 1130)    Skin assessment:  Pressure Injury 06/14/22 Buttocks Right;Upper Stage 2 -  Partial thickness loss of dermis presenting as a shallow open injury with a red, pink wound bed without slough. Pea size (Active)  06/14/22 0500  Location: Buttocks  Location Orientation: Right;Upper  Staging: Stage 2 -  Partial thickness loss of dermis presenting as a shallow open injury with a red, pink wound bed without slough.  Wound Description (Comments): Pea size  Present on Admission:     Nutritional status:  Body mass index is 20.05 kg/m.   Nutrition Problem: Moderate Malnutrition Etiology: chronic illness (alcohol abuse and pancreatitis) Signs/Symptoms: moderate fat depletion, moderate muscle depletion     Diet:  Diet Order             Diet clear liquid Room service appropriate? Yes; Fluid consistency: Thin  Diet effective 0500 tomorrow                   DVT prophylaxis:  heparin injection 5,000 Units Start: 06/11/22 0800   Antimicrobials: IV ampicillin Fluid: None Consultants: Nephro, cardiology Family Communication: None at bedside  Status is: Inpatient  Continue in-hospital care because: On IV antibiotics Level of care: Telemetry Medical   Dispo: The patient is from: Home              Anticipated d/c is to: Pending clinical course              Patient currently is not medically stable to d/c.   Difficult to place patient No    Antimicrobials: Anti-infectives (From admission, onward)    Start     Dose/Rate Route Frequency Ordered Stop   06/16/22 1000  ampicillin (OMNIPEN) 2 g in sodium chloride 0.9 % 100 mL IVPB        2 g 300 mL/hr over 20 Minutes Intravenous Every 8 hours 06/16/22 0808     06/15/22 1500  ampicillin (  OMNIPEN) 2 g in sodium chloride 0.9 % 100 mL IVPB  Status:  Discontinued        2 g 300 mL/hr over 20 Minutes Intravenous Every 6 hours 06/15/22 1404 06/16/22 0808   06/13/22 2200  ampicillin (OMNIPEN) 2 g in sodium chloride 0.9 % 100 mL IVPB  Status:  Discontinued        2 g 300 mL/hr over 20 Minutes Intravenous Every 8 hours 06/13/22 1208 06/15/22 1404   06/12/22 0800  cefTRIAXone (ROCEPHIN) 2 g in sodium chloride 0.9 % 100 mL IVPB  Status:  Discontinued        2 g 200 mL/hr over 30 Minutes Intravenous Every 24 hours 06/11/22 1650 06/13/22 1208   06/11/22 2200  linezolid (ZYVOX) IVPB 600 mg  Status:  Discontinued        600 mg 300 mL/hr over 60 Minutes Intravenous Every 12 hours 06/11/22 1100 06/11/22 1307   06/11/22 1200  ceFEPIme (MAXIPIME) 1 g in sodium chloride 0.9 %  100 mL IVPB  Status:  Discontinued        1 g 200 mL/hr over 30 Minutes Intravenous Every 24 hours 06/11/22 1034 06/11/22 1648   06/11/22 1200  linezolid (ZYVOX) IVPB 600 mg  Status:  Discontinued        600 mg 300 mL/hr over 60 Minutes Intravenous  Once 06/11/22 1047 06/11/22 1351   06/11/22 1100  metroNIDAZOLE (FLAGYL) IVPB 500 mg  Status:  Discontinued        500 mg 100 mL/hr over 60 Minutes Intravenous Every 12 hours 06/11/22 1006 06/13/22 1208   06/10/22 2330  cefTRIAXone (ROCEPHIN) 2 g in sodium chloride 0.9 % 100 mL IVPB  Status:  Discontinued        2 g 200 mL/hr over 30 Minutes Intravenous Every 24 hours 06/10/22 2329 06/11/22 1004   06/10/22 2330  azithromycin (ZITHROMAX) 500 mg in sodium chloride 0.9 % 250 mL IVPB  Status:  Discontinued        500 mg 250 mL/hr over 60 Minutes Intravenous Every 24 hours 06/10/22 2329 06/11/22 1004       Objective: Vitals:   06/18/22 1500 06/18/22 1510  BP: 111/75 112/74  Pulse: 90 92  Resp: (!) 29 (!) 31  Temp:    SpO2: 99% 100%    Intake/Output Summary (Last 24 hours) at 06/18/2022 1600 Last data filed at 06/18/2022 0900 Gross per 24 hour  Intake 240 ml  Output 3775 ml  Net -3535 ml   Filed Weights   06/17/22 1427 06/17/22 1814 06/18/22 0511  Weight: 62.1 kg 59.5 kg 59.8 kg   Weight change: 3.7 kg Body mass index is 20.05 kg/m.   Physical Exam: General exam: Pleasant, middle-aged African-American male.  NG tube attached to suction Skin: No rashes, lesions or ulcers. HEENT: Atraumatic, normocephalic, no obvious bleeding Lungs: Clear to auscultation bilaterally CVS: Regular rate and rhythm, no murmur GI/Abd soft, distended, mild tenderness present, bowel sound present CNS: Alert, awake, oriented x 3 Psychiatry: Sad affect Extremities: No pedal edema, no calf tenderness  Data Review: I have personally reviewed the laboratory data and studies available.  F/u labs ordered Unresulted Labs (From admission, onward)      Start     Ordered   06/19/22 0500  Renal function panel  Daily,   R     Question:  Specimen collection method  Answer:  Unit=Unit collect   06/18/22 1226   06/17/22 1400  Miscellaneous [facility] test (send-out)  Once,   R        06/17/22 1400            Total time spent in review of labs and imaging, patient evaluation, formulation of plan, documentation and communication with family - 55 minutes  Signed, Lorin Glass, MD [facility] 06/18/2022

## 2022-06-18 NOTE — Progress Notes (Signed)
SLP Cancellation Note  Patient Details Name: JOVANNIE ULIBARRI MRN: [id number] DOB: 11/22/1970   Cancelled treatment:       Reason Eval/Treat Not Completed: Patient not medically ready.  Pt is currently NPO for TEE and possible ileus.  SLP will f/u for bedside swallow evaluation as medically appropriate.    Elvia Collum Rawn Quiroa 06/18/2022, 8:58 AM

## 2022-06-19 DIAGNOSIS — J9601 Acute respiratory failure with hypoxia: Secondary | ICD-10-CM | POA: Diagnosis not present

## 2022-06-19 LAB — MISC LABCORP TEST (SEND OUT): Labcorp test code: 6510

## 2022-06-19 LAB — RENAL FUNCTION PANEL
Albumin: 1.7 g/dL — ABNORMAL LOW (ref 3.5–5.0)
Anion gap: 12 (ref 5–15)
BUN: 61 mg/dL — ABNORMAL HIGH (ref 6–20)
CO2: 26 mmol/L (ref 22–32)
Calcium: 8.1 mg/dL — ABNORMAL LOW (ref 8.9–10.3)
Chloride: 98 mmol/L (ref 98–111)
Creatinine, Ser: 4.61 mg/dL — ABNORMAL HIGH (ref 0.61–1.24)
GFR, Estimated: 14 mL/min — ABNORMAL LOW (ref >60)
Glucose, Bld: 128 mg/dL — ABNORMAL HIGH (ref 70–99)
Phosphorus: 5.3 mg/dL — ABNORMAL HIGH (ref 2.5–4.6)
Potassium: 3.6 mmol/L (ref 3.5–5.1)
Sodium: 136 mmol/L (ref 135–145)

## 2022-06-19 LAB — ECHO TEE

## 2022-06-19 MED ORDER — SODIUM CHLORIDE 0.9% FLUSH: 10.0000 mL | INTRAVENOUS | Status: DC | PRN

## 2022-06-19 MED ORDER — ALBUMIN HUMAN 25 % IV SOLN
12.5000 g | Freq: Once | INTRAVENOUS | Status: AC
  Administered 2022-06-19: 12.5 g via INTRAVENOUS
  Filled 2022-06-19: qty 50

## 2022-06-19 MED ORDER — SODIUM CHLORIDE 0.9% FLUSH
10.0000 mL | Freq: Two times a day (BID) | INTRAVENOUS | Status: DC
  Administered 2022-06-19: 20 mL
  Administered 2022-06-22 – 2022-06-25 (×7): 10 mL

## 2022-06-19 MED ORDER — NEPRO/CARBSTEADY PO LIQD
237.0000 mL | Freq: Two times a day (BID) | ORAL | Status: DC
  Administered 2022-06-20 – 2022-06-21 (×3): 237 mL via ORAL

## 2022-06-19 MED ORDER — FUROSEMIDE 10 MG/ML IJ SOLN
40.0000 mg | Freq: Once | INTRAMUSCULAR | Status: AC
  Administered 2022-06-19: 40 mg via INTRAVENOUS
  Filled 2022-06-19: qty 4

## 2022-06-19 NOTE — Progress Notes (Addendum)
Nutrition Brief Note:   Pt remained on a CL diet this am. RD checked in with MD who reports that the pt had pulled NG tube out (that was to suction) and had a BM. MD was able to advance pt's diet today. RD will add supplements and continue to monitor diet tolerance. If intakes do not improve within 24-48 hrs, recommend starting alternate means of nutrition (I.e. EN or PN, which ever aligns with goals of care).   Thalia Bloodgood, RD, LDN, CNSC.

## 2022-06-19 NOTE — Progress Notes (Signed)
Pt removed his NG tube. On call provider; Opyd, MD; notified. MD requested to leave the NG tube.out and stated that he will leave a note for Dr. Pietro Cassis.

## 2022-06-19 NOTE — Evaluation (Signed)
Clinical/Bedside Swallow Evaluation Patient Details  Name: James [name] MRN: [id number] Date of Birth: 06-25-1970  Today's Date: 06/19/2022 Time: SLP Start Time (ACUTE ONLY): 1152 SLP Stop Time (ACUTE ONLY): 1207 SLP Time Calculation (min) (ACUTE ONLY): 15 min  Past Medical History:  Past Medical History:  Diagnosis Date   Alcohol abuse    Pancreatitis    Tobacco abuse    Past Surgical History: History reviewed. No pertinent surgical history. HPI:  Pt is a 52 yo male presenting to [facility] on 1/3 with abdominal pain and vomiting. Imaging negative for acute finding. Admitted for evaluation of PNA and AKI. 1/5 CRRT started, stopped 1/8. Episode of SVT. 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  PMH: ETOH abuse, tobacco abuse, pancreatitis.    Assessment / Plan / Recommendation  Clinical Impression  Pt was alert, interactive and appropriate for swallow assessment. Found to have majority of dentition intact with normal oromotor range and strength with strong congested cough. Pt coughed several times throughout evaluation but did not appear to be related to po intake. Swallows were timely and coordinated well actoss textures. Recommend upgrade to regular (order written), thin liquids, pills with thin. Therapist assisted pt in ordering a regular tray as he did not wish to eat the puree tray that arrived. No further ST is needed at this time. SLP Visit Diagnosis: Dysphagia, unspecified (R13.10)    Aspiration Risk  No limitations    Diet Recommendation Regular;Thin liquid   Liquid Administration via: Straw;Cup Medication Administration: Whole meds with liquid Supervision: Patient able to self feed Compensations: Slow rate;Small sips/bites Postural Changes: Seated upright at 90 degrees    Other  Recommendations Oral Care Recommendations: Oral care BID    Recommendations for follow up therapy are one component of a multi-disciplinary discharge planning process, led by the attending physician.   Recommendations may be updated based on patient status, additional functional criteria and insurance authorization.  Follow up Recommendations No SLP follow up      Assistance Recommended at Discharge    Functional Status Assessment Patient has not had a recent decline in their functional status  Frequency and Duration            Prognosis        Swallow Study   General Date of Onset: 06/10/22 HPI: Pt is a 52 yo male presenting to [facility] on 1/3 with abdominal pain and vomiting. Imaging negative for acute finding. Admitted for evaluation of PNA and AKI. 1/5 CRRT started, stopped 1/8. Episode of SVT. 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  PMH: ETOH abuse, tobacco abuse, pancreatitis. Type of Study: Bedside Swallow Evaluation Previous Swallow Assessment:  (none) Diet Prior to this Study: Dysphagia 1 (puree);Thin liquids Temperature Spikes Noted: No Respiratory Status: Room air History of Recent Intubation: No Behavior/Cognition: Alert;Cooperative;Pleasant mood Oral Cavity Assessment: Within Functional Limits Oral Care Completed by SLP: No Oral Cavity - Dentition: Adequate natural dentition Vision: Functional for self-feeding Self-Feeding Abilities: Able to feed self Patient Positioning: Upright in bed Baseline Vocal Quality: Normal Volitional Cough: Strong;Congested Volitional Swallow: Able to elicit    Oral/Motor/Sensory Function Overall Oral Motor/Sensory Function: Within functional limits   Ice Chips Ice chips: Not tested   Thin Liquid Thin Liquid: Within functional limits Presentation: Straw    Nectar Thick Nectar Thick Liquid: Not tested   Honey Thick Honey Thick Liquid: Not tested   Puree Puree: Within functional limits   Solid     Solid: Within functional limits  Houston Siren 06/19/2022,12:41 PM

## 2022-06-19 NOTE — Progress Notes (Signed)
Physical Therapy Treatment Patient Details Name: James [name] MRN: [id number] DOB: [birth year] Today's Date: 06/19/2022   History of Present Illness Pt is a 52yo male presenting to [facility] on 1/3 with abdominal pain and vomiting. Imaging negative for acute finding. Admitted for evaluation of PNA and AKI. 1/5 CRRT started, stopped 1/8. Episode of SVT. 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  PMH: ETOH abuse, tobacco abuse, pancreatitis.    PT Comments    Tolerated mobility better today. Mod assist initially for sit<>stand transfer, progressed to min assist. Pre-gait training, impaired balance, declines ambulating or transferring to chair due to feeling fatigued after bed mobility and sit<>stand training. Patient will continue to benefit from skilled physical therapy services to further improve independence with functional mobility.    Recommendations for follow up therapy are one component of a multi-disciplinary discharge planning process, led by the attending physician.  Recommendations may be updated based on patient status, additional functional criteria and insurance authorization.  Follow Up Recommendations  Skilled nursing-short term rehab (<3 hours/day) Can patient physically be transported by private vehicle: Yes   Assistance Recommended at Discharge Frequent or constant Supervision/Assistance  Patient can return home with the following Help with stairs or ramp for entrance;Assist for transportation;Assistance with cooking/housework;A lot of help with bathing/dressing/bathroom;A lot of help with walking and/or transfers   Equipment Recommendations  None recommended by PT (TBD)    Recommendations for Other Services       Precautions / Restrictions Precautions Precautions: Fall Precaution Comments: pt reports hx of falling Restrictions Weight Bearing Restrictions: No     Mobility  Bed Mobility Overal bed mobility: Needs Assistance Bed Mobility: Supine to Sit, Sit to Supine      Supine to sit: HOB elevated, Min assist Sit to supine: Min guard   General bed mobility comments: Min assist for trunk support to rise to EOB, cues throughout. Considerable amount of time. Min guard return to bed. able to reposition without assist.    Transfers Overall transfer level: Needs assistance Equipment used: 1 person hand held assist Transfers: Sit to/from Stand Sit to Stand: Mod assist           General transfer comment: Mod assist for boost to stand, initially with posterior LOB and rightward lean. Practiced a couple more times with cues and able to perform at min assist level. Cues for wide BOS. Placing feet in unusual stance too far forward when attempting without cues.    Ambulation/Gait             Pre-gait activities: Progressed with BIL UE support march, weight shifting. General Gait Details: declined   Marine scientist Rankin (Stroke Patients Only)       Balance Overall balance assessment: Needs assistance Sitting-balance support: Feet supported, Single extremity supported Sitting balance-Leahy Scale: Poor   Postural control: Right lateral lean Standing balance support: Single extremity supported Standing balance-Leahy Scale: Poor Standing balance comment: Progressed to single UE support                            Cognition Arousal/Alertness: Awake/alert Behavior During Therapy: WFL for tasks assessed/performed Overall Cognitive Status: Impaired/Different from baseline Area of Impairment: Orientation, Following commands, Problem solving                 Orientation Level: Disoriented to, Time (wrong month)  Following Commands: Follows one step commands consistently, Follows one step commands with increased time     Problem Solving: Slow processing, Requires verbal cues          Exercises      General Comments        Pertinent Vitals/Pain Pain Assessment Pain  Assessment: Faces Faces Pain Scale: Hurts little more Pain Location: upper abdomen Pain Descriptors / Indicators: Aching Pain Intervention(s): Monitored during session    Home Living                          Prior Function            PT Goals (current goals can now be found in the care plan section) Acute Rehab PT Goals Patient Stated Goal: To get stronger PT Goal Formulation: With patient Time For Goal Achievement: 06/30/22 Potential to Achieve Goals: Fair Progress towards PT goals: Progressing toward goals    Frequency    Min 2X/week      PT Plan Current plan remains appropriate    Co-evaluation              AM-PAC PT "6 Clicks" Mobility   Outcome Measure  Help needed turning from your back to your side while in a flat bed without using bedrails?: A Little Help needed moving from lying on your back to sitting on the side of a flat bed without using bedrails?: A Little Help needed moving to and from a bed to a chair (including a wheelchair)?: A Lot Help needed standing up from a chair using your arms (e.g., wheelchair or bedside chair)?: A Lot Help needed to walk in hospital room?: Total Help needed climbing 3-5 steps with a railing? : Total 6 Click Score: 12    End of Session Equipment Utilized During Treatment: Gait belt Activity Tolerance: Patient limited by fatigue Patient left: in bed;with call bell/phone within reach;with bed alarm set   PT Visit Diagnosis: Difficulty in walking, not elsewhere classified (R26.2);Muscle weakness (generalized) (M62.81);History of falling (Z91.81);Unsteadiness on feet (R26.81);Other abnormalities of gait and mobility (R26.89)     Time: 9485-4627 PT Time Calculation (min) (ACUTE ONLY): 16 min  Charges:  $Therapeutic Activity: 8-22 mins                     Candie Mile, PT, DPT Physical Therapist Acute Rehabilitation Services [place]    Ellouise Newer 06/19/2022, 10:42 AM

## 2022-06-19 NOTE — Consult Note (Signed)
Ref: Patient, No Pcp Per   Subjective:  Awake. Tolerating oral feedings. He is making half way good urine. TEE showed Ascites. He has severe hypoalbuminemia. Dialysis plan is postponed for now. He has good LV systolic function.  Objective:  Vital Signs in the last 24 hours: Temp:  [97.6 F (36.4 C)-99.2 F (37.3 C)] 98.5 F (36.9 C) (01/12 0857) Pulse Rate:  [90-103] 100 (01/12 0857) Cardiac Rhythm: Sinus tachycardia (01/11 1900) Resp:  [18-37] 18 (01/12 0857) BP: (104-115)/(72-82) 115/72 (01/12 0857) SpO2:  [94 %-100 %] 100 % (01/12 0857) Weight:  [62 kg] 62 kg (01/11 2117)  Physical Exam: BP Readings from Last 1 Encounters:  06/19/22 115/72     Wt Readings from Last 1 Encounters:  06/18/22 62 kg    Weight change: -0.1 kg Body mass index is 20.78 kg/m. HEENT: [place]/AT, Eyes-Brown, Conjunctiva-Pale pink, Sclera-Non-icteric Neck: No JVD, No bruit, Trachea midline. Lungs:  Clearing, Bilateral. Cardiac:  Regular rhythm, normal S1 and S2, no S3. II/VI systolic murmur. Abdomen:  Soft, distended, non-tender. BS present. Extremities:  Trace edema present. No cyanosis. No clubbing. CNS: AxOx3, Cranial nerves grossly intact, moves all 4 extremities.  Skin: Warm and dry.   Intake/Output from previous day: 01/11 0701 - 01/12 0700 In: 1404.7 [P.O.:954; IV Piggyback:450.7] Out: 1025 [Urine:1025]    Lab Results: BMET    Component Value Date/Time   NA 136 06/19/2022 0846   NA 139 06/18/2022 0334   NA 140 06/17/2022 0426   K 3.6 06/19/2022 0846   K 3.6 06/18/2022 0334   K 4.2 06/17/2022 0426   CL 98 06/19/2022 0846   CL 101 06/18/2022 0334   CL 111 06/17/2022 0426   CO2 26 06/19/2022 0846   CO2 26 06/18/2022 0334   CO2 18 (L) 06/17/2022 0426   GLUCOSE 128 (H) 06/19/2022 0846   GLUCOSE 99 06/18/2022 0334   GLUCOSE 123 (H) 06/17/2022 0426   BUN 61 (H) 06/19/2022 0846   BUN 37 (H) 06/18/2022 0334   BUN 55 (H) 06/17/2022 0426   CREATININE 4.61 (H) 06/19/2022 0846    CREATININE 3.42 (H) 06/18/2022 0334   CREATININE 4.42 (H) 06/17/2022 0426   CALCIUM 8.1 (L) 06/19/2022 0846   CALCIUM 8.1 (L) 06/18/2022 0334   CALCIUM 8.3 (L) 06/17/2022 0426   GFRNONAA 14 (L) 06/19/2022 0846   GFRNONAA 21 (L) 06/18/2022 0334   GFRNONAA 15 (L) 06/17/2022 0426   GFRAA  02/12/2010 0949    >60        The eGFR has been calculated using the MDRD equation. This calculation has not been validated in all clinical situations. eGFR's persistently <60 mL/min signify possible Chronic Kidney Disease.   CBC    Component Value Date/Time   WBC 12.4 (H) 06/17/2022 0426   RBC 3.07 (L) 06/17/2022 0426   HGB 10.4 (L) 06/17/2022 0426   HCT 29.7 (L) 06/17/2022 0426   PLT 241 06/17/2022 0426   MCV 96.7 06/17/2022 0426   MCH 33.9 06/17/2022 0426   MCHC 35.0 06/17/2022 0426   RDW 15.8 (H) 06/17/2022 0426   LYMPHSABS 0.1 (L) 06/12/2022 0304   MONOABS 0.1 06/12/2022 0304   EOSABS 0.0 06/12/2022 0304   BASOSABS 0.0 06/12/2022 0304   HEPATIC Function Panel Recent Labs    06/11/22 0902 06/12/22 0304 06/12/22 1527 06/13/22 0245 06/13/22 0247 06/17/22 0426 06/18/22 0334 06/19/22 0846  PROT 6.7 5.8*  --  5.7*  --   --   --   --   ALBUMIN  2.5* 2.1*   < > 2.0*   < > 1.8* 1.8* 1.7*  AST 104* 108*  --  66*  --   --   --   --   ALT 21 20  --  20  --   --   --   --   ALKPHOS 17* 19*  --  28*  --   --   --   --    < > = values in this interval not displayed.   HEMOGLOBIN A1C Lab Results  Component Value Date   MPG 111 06/11/2022   CARDIAC ENZYMES No results found for: "CKTOTAL", "CKMB", "CKMBINDEX", "TROPONINI" BNP No results for input(s): "PROBNP" in the last 8760 hours. TSH Recent Labs    06/11/22 0902  TSH 0.872   CHOLESTEROL No results for input(s): "CHOL" in the last 8760 hours.  Scheduled Meds:  Chlorhexidine Gluconate Cloth  6 each Topical Q2200   Chlorhexidine Gluconate Cloth  6 each Topical Q0600   docusate sodium  100 mg Oral BID   feeding supplement  (NEPRO CARB STEADY)  237 mL Oral BID BM   folic acid  1 mg Intravenous Daily   furosemide  40 mg Intravenous Once   heparin  5,000 Units Subcutaneous Q8H   lactulose  20 g Oral Daily   metoCLOPramide (REGLAN) injection  5 mg Intravenous Q8H   multivitamin with minerals  1 tablet Oral Daily   nicotine  21 mg Transdermal Daily   mouth rinse  15 mL Mouth Rinse 4 times per day   pantoprazole  40 mg Oral Daily   polyethylene glycol  17 g Oral Daily   predniSONE  40 mg Oral Q breakfast   sodium chloride flush  10-40 mL Intracatheter Q12H   Continuous Infusions:  ampicillin (OMNIPEN) IV 2 g (06/19/22 1026)   thiamine (VITAMIN B1) injection 500 mg (06/19/22 1029)   PRN Meds:.acetaminophen **OR** acetaminophen, hydrALAZINE, LORazepam, ondansetron **OR** ondansetron (ZOFRAN) IV, mouth rinse, sodium chloride flush  Assessment/Plan: Multifocal pneumonia Bacteremia, endocarditis ruled out. Alcohol use disorder Tobacco use disorder Gout Acute on chronic kidney disease Severe hypoalbuminemia  Plan: One dose IV albumin followed by one time IV lasix use.   LOS: 8 days   Time spent including chart review, lab review, examination, discussion with patient/Mom/Doctor/Nurse : 30 min   Dixie Dials  MD  06/19/2022, 1:39 PM

## 2022-06-19 NOTE — Progress Notes (Signed)
**James James** James James  Subjective: creat up 4.6 today, was 3.4 yesterday. Last HD was 1/10 and then creat next am 1/11 was 3.4 and now today, w/o HD, creat is 4.61.  UOP better w/ 825 cc yest and 700 cc today. Eating and drinking.   Vitals:   06/18/22 2117 06/19/22 0000 06/19/22 0454 06/19/22 0857  BP: 108/77 108/77 113/82 115/72  Pulse: (!) 103 90 (!) 102 100  Resp: 18  18 18   Temp: 99.2 F (37.3 C)  98.9 F (37.2 C) 98.5 F (36.9 C)  TempSrc: Oral  Oral Oral  SpO2: 94%  95% 100%  Weight: 62 kg     Height:        Exam: General adult male in bed Lungs breathing comfortably; on RA Heart S1s2 no rub tachycardic Abdomen soft, no ascites Extremities no edema lower extremities  Neuro more oriented and responsive today Access RIJ nontunneled catheter in place    UA cloudy, large Hb, 100 prot, >50rbc, 21-50 wbc/ epis  UNa 69, UCr 101   CXR - 1/08, no edema, R basilar process  Assessment/ Plan: AKI - suspect secondary to prolonged pre-renal insults leading to ATN in setting of alcohol abuse and severe pneumonia.Marland Kitchen required pressors on two different occasions for 48 hrs or less. Now is normotensive. 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  Serologies were all wnl (ANA, ANCA, GBM and C3/C4). Patient got CRRT 1/05 - 1/08.  IVF"s started 1/08 for low CVP. Pt moved to [facility] and had iHD once here on 1/10. Post HD creat on 1/11 was 3.4, and today is up to 4.6. UOP much improved now. Suspect he is recovering. He is eating and drinking. Will dc foley catheter. Keeping HD cath in for now but no need for HD today. Will follow.  Acute resp failure/ vol overload - 3 L UF yest and vol overload/ resp issues resolved. Euvolemic today.  Multifocal/ strep pneumoniae w/ bacteremia - antibiotics per primary team.   AMS - multifactorial w/ AKI, alcohol abuse . Much better.  Alcohol abuse/ ^LFT's - withdrawal protocol per primary team   James James 06/19/2022, 11:35  AM   Recent Labs  Lab 06/16/22 0437 06/17/22 0426 06/18/22 0334 06/19/22 0846  HGB 10.3* 10.4*  --   --   ALBUMIN 1.7* 1.8* 1.8* 1.7*  CALCIUM 7.8* 8.3* 8.1* 8.1*  PHOS 4.8* 6.9* 6.1* 5.3*  CREATININE 3.26* 4.42* 3.42* 4.61*  K 3.9 4.2 3.6 3.6    No results for input(s): "IRON", "TIBC", "FERRITIN" in the last 168 hours. Inpatient medications:  Chlorhexidine Gluconate Cloth  6 each Topical Q2200   Chlorhexidine Gluconate Cloth  6 each Topical Q0600   docusate sodium  100 mg Oral BID   feeding supplement  1 Container Oral TID BM   folic acid  1 mg Intravenous Daily   heparin  5,000 Units Subcutaneous Q8H   lactulose  20 g Oral Daily   metoCLOPramide (REGLAN) injection  5 mg Intravenous Q8H   multivitamin with minerals  1 tablet Oral Daily   nicotine  21 mg Transdermal Daily   mouth rinse  15 mL Mouth Rinse 4 times per day   pantoprazole  40 mg Oral Daily   polyethylene glycol  17 g Oral Daily   predniSONE  40 mg Oral Q breakfast   sodium chloride flush  10-40 mL Intracatheter Q12H    ampicillin (OMNIPEN) IV 2 g (06/19/22 1026)   thiamine (VITAMIN B1) injection  500 mg (06/19/22 1029)   acetaminophen **OR** acetaminophen, hydrALAZINE, LORazepam, ondansetron **OR** ondansetron (ZOFRAN) IV, mouth rinse, sodium chloride flush

## 2022-06-19 NOTE — TOC Progression Note (Signed)
Transition of Care [facility]) - Initial/Assessment Note    Patient Details  Name: James [name] MRN: [id number] Date of Birth: 1971-01-05  [facility]) CM/SW Contact:    Milinda Antis, [place] Phone Number: 06/19/2022, 3:02 PM  Clinical Narrative:                 LCSW following.  FL2 will be completed when patient is closer to being medically ready.    Expected Discharge Plan: Home/Self Care Barriers to Discharge: Continued Medical Work up   Patient Goals and CMS Choice            Expected Discharge Plan and Services       Living arrangements for the past 2 months: Single Family Home                                      Prior Living Arrangements/Services Living arrangements for the past 2 months: [place] of Daily Living Home Assistive Devices/Equipment: None ADL Screening (condition at time of admission) Patient's cognitive ability adequate to safely complete daily activities?: No Is the patient deaf or have difficulty hearing?: No Does the patient have difficulty seeing, even when wearing glasses/contacts?: No Does the patient have difficulty concentrating, remembering, or making decisions?: Yes Patient able to express need for assistance with ADLs?: Yes Does the patient have difficulty dressing or bathing?: No Independently performs ADLs?: Yes (appropriate for developmental age) Does the patient have difficulty walking or climbing stairs?: No Weakness of Legs: None Weakness of Arms/Hands: None  Permission Sought/Granted                  Emotional Assessment              Admission diagnosis:  Acute respiratory failure with hypoxia ([place]) [J96.01] AKI (acute kidney injury) ([place]) [N17.9] Multifocal pneumonia [J18.9] Sepsis, due to unspecified organism, unspecified whether acute organ dysfunction present [facility]) [A41.9] Patient Active Problem List   Diagnosis Date Noted    Protein-calorie malnutrition, moderate ([place]) 06/17/2022   Sepsis due to Streptococcus pneumoniae ([place]) 06/17/2022   Pressure injury of skin 54/65/6812   Acute metabolic encephalopathy 75/17/0017   Alcohol abuse 06/17/2022   Gouty arthritis of right great toe 06/17/2022   Nicotine dependence, cigarettes, uncomplicated 49/44/9675   Acute kidney injury (AKI) with acute tubular necrosis (ATN) ([place]) 06/12/2022   Acute respiratory failure with hypoxia ([place]) 06/11/2022   PCP:  Patient, No Pcp Per Pharmacy:   [facility] [place] [address] [place] [address] Phone: [phone] Fax: [place], [place] [address] Phone: [phone] Fax: [phone]     Social Determinants of Health (SDOH) Social History: SDOH Screenings   Food Insecurity: No Food Insecurity (06/15/2022)  Housing: Low Risk  (06/18/2022)  Tobacco Use: High Risk (06/18/2022)   SDOH Interventions:     Readmission Risk Interventions     No data to display

## 2022-06-19 NOTE — Progress Notes (Signed)
PROGRESS NOTE  James [name]  DOB: [birth year]  PCP: Patient, No Pcp Per [id number]  DOA: 06/10/2022  LOS: 8 days  Hospital Day: 10  Brief narrative: James [name] is a 52 y.o. male with PMH significant for chronic alcoholism, chronic smoking, pancreatitis. 1/3, patient presented to [facility] ED with complaint of abdominal pain, shortness of breath In the ED, patient was found to be extremely short of breath, tachypneic, requiring high flow oxygen.   Workup revealed creatinine significantly elevated to 7.74 Chest x-ray showed right middle lobe and bibasilar pneumonia Patient was started on IV antibiotics, IV fluid for sepsis Admitted to [facility] Nephrology consultation was obtained. Workup included ANA/ANCA/GBM/complement levels all of which were unremarkable.   1/5, patient was started on CRRT 1/8, right IJ hemodialysis catheter was placed 1/10, transferred to [facility] for maintenance hemodialysis  Of note, blood cultures sent on admission showed Streptococcus pneumonia bacteremia in 1 out of 4 bottles TTE 1/8 was concerning for endocarditis on the anterior leaflet of mitral valve.  Cardiology was consulted for consideration of TEE See below for details  Subjective: Patient was seen and examined this morning. Lying on bed.  Not in distress.  Patient pulled out NG tube last night. Apparently has no nausea vomiting.  Had a bowel this morning and is tolerating diet. Mental status is gradually improving.  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   Blood cultures sent on admission showed Streptococcus pneumonia bacteremia in 1 out of 4 bottles TTE 1/8 was concerning for endocarditis on the anterior leaflet of mitral valve.  Cardiology was consulted for consideration of TEE Repeat blood cultures sent on 1/8 does not  show any growth so far. Currently on IV antibiotics Recent Labs  Lab 06/13/22 0245 06/14/22 0315 06/15/22 0244 06/16/22 0437 06/17/22 0426  WBC 9.1 9.1 10.3 10.8* 12.4*  PROCALCITON >150.00  --   --   --   --    Acute kidney injury (AKI) Presented with creatinine significantly elevated to 7.74  1/5, patient was started on CRRT 1/8, right IJ hemodialysis catheter was placed 1/10, transferred to [facility] for maintenance hemodialysis Workup thus far negative including negative ANA's, ANCA, GBM, complement levels Nephrology following Recent Labs    06/13/22 1609 06/14/22 0315 06/14/22 1534 06/15/22 0244 06/15/22 0247 06/15/22 1543 06/16/22 0437 06/17/22 0426 06/18/22 0334 06/19/22 0846  BUN 35* 31* 24* 21* 24* 22* 35* 55* 37* 61*  CREATININE 2.58* 2.16* 1.82* 1.77* 1.89* 2.06* 3.26* 4.42* 3.42* 4.61*   Acute metabolic encephalopathy multifactorial secondary to infection, uremia TSH, vitamin B12, ammonia, drug screen, VBG unremarkable on 1/4 CT head 1/3 unremarkable  Monitoring for spontaneous improvement with management of renal disease and treatment of infection   Alcohol abuse Patient was managed with CIWA protocol for the first several days of hospitalization and has since been discontinued.   Protein-calorie malnutrition, moderate  Nutrition following, their input is appreciated Boost nutritional supplements Assisting patient with all meals Daily multivitamin   Gouty arthritis of right great toe Identified 1/8 and confirmed via podiatry consultation at that time with Dr. Ralene Cork Patient had been placed on prednisone 40 mg daily as of 1/9   Nicotine dependence, cigarettes, uncomplicated Providing patient with daily nicotine patches   Abdominal ileus X-ray abdomen 1/10 and 1/11 showed colonic dilatation with persistent gaseous distention of the intestinal tract. Findings most compatible with ileus.  I kept NG tube clamped yesterday and was planning removed  today.  Patient evidently pulled out NG tube last night. No nausea or vomiting.  Reports he had a bowel movement today.  Scheduled Reglan for next 2 days. Speech therapy evaluation obtained.  Recommended regular consistency diet..  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  PT evaluation ordered  Goals of care   Code Status: Full Code    Scheduled Meds:  Chlorhexidine Gluconate Cloth  6 each Topical Q2200   Chlorhexidine Gluconate Cloth  6 each Topical Q0600   docusate sodium  100 mg Oral BID   feeding supplement  1 Container Oral TID BM   folic acid  1 mg Intravenous Daily   furosemide  40 mg Intravenous Once   heparin  5,000 Units Subcutaneous Q8H   lactulose  20 g Oral Daily   metoCLOPramide (REGLAN) injection  5 mg Intravenous Q8H   multivitamin with minerals  1 tablet Oral Daily   nicotine  21 mg Transdermal Daily   mouth rinse  15 mL Mouth Rinse 4 times per day   pantoprazole  40 mg Oral Daily   polyethylene glycol  17 g Oral Daily   predniSONE  40 mg Oral Q breakfast   sodium chloride flush  10-40 mL Intracatheter Q12H    PRN meds: acetaminophen **OR** acetaminophen, hydrALAZINE, LORazepam, ondansetron **OR** ondansetron (ZOFRAN) IV, mouth rinse, sodium chloride flush   Infusions:   ampicillin (OMNIPEN) IV 2 g (06/19/22 1026)   thiamine (VITAMIN B1) injection 500 mg (06/19/22 1029)    Skin assessment:  Pressure Injury 06/14/22 Buttocks Right;Upper Stage 2 -  Partial thickness loss of dermis presenting as a shallow open injury with a red, pink wound bed without slough. Pea size (Active)  06/14/22 0500  Location: Buttocks  Location Orientation: Right;Upper  Staging: Stage 2 -  Partial thickness loss of dermis presenting as a shallow open injury with a red, pink wound bed without slough.  Wound Description (Comments): Pea size  Present on Admission:     Nutritional status:  Body mass index  is 20.78 kg/m.  Nutrition Problem: Moderate Malnutrition Etiology: chronic illness (alcohol abuse and pancreatitis) Signs/Symptoms: moderate fat depletion, moderate muscle depletion     Diet:  Diet Order             Diet renal with fluid restriction Fluid restriction: 1200 mL Fluid; Room service appropriate? Yes; Fluid consistency: Thin  Diet effective now                   DVT prophylaxis:  heparin injection 5,000 Units Start: 06/11/22 0800   Antimicrobials: IV ampicillin Fluid: None Consultants: Nephro, cardiology Family Communication: None at bedside  Status is: Inpatient  Continue in-hospital care because: On IV antibiotics Level of care: Telemetry Medical   Dispo: The patient is from: Home              Anticipated d/c is to: Pending clinical course              Patient currently is not medically stable to d/c.   Difficult to place patient No    Antimicrobials: Anti-infectives (From admission, onward)    Start     Dose/Rate Route Frequency Ordered Stop   06/16/22 1000  ampicillin (OMNIPEN) 2 g in sodium chloride 0.9 % 100 mL IVPB        2 g 300 mL/hr over 20  Minutes Intravenous Every 8 hours 06/16/22 0808     06/15/22 1500  ampicillin (OMNIPEN) 2 g in sodium chloride 0.9 % 100 mL IVPB  Status:  Discontinued        2 g 300 mL/hr over 20 Minutes Intravenous Every 6 hours 06/15/22 1404 06/16/22 0808   06/13/22 2200  ampicillin (OMNIPEN) 2 g in sodium chloride 0.9 % 100 mL IVPB  Status:  Discontinued        2 g 300 mL/hr over 20 Minutes Intravenous Every 8 hours 06/13/22 1208 06/15/22 1404   06/12/22 0800  cefTRIAXone (ROCEPHIN) 2 g in sodium chloride 0.9 % 100 mL IVPB  Status:  Discontinued        2 g 200 mL/hr over 30 Minutes Intravenous Every 24 hours 06/11/22 1650 06/13/22 1208   06/11/22 2200  linezolid (ZYVOX) IVPB 600 mg  Status:  Discontinued        600 mg 300 mL/hr over 60 Minutes Intravenous Every 12 hours 06/11/22 1100 06/11/22 1307   06/11/22  1200  ceFEPIme (MAXIPIME) 1 g in sodium chloride 0.9 % 100 mL IVPB  Status:  Discontinued        1 g 200 mL/hr over 30 Minutes Intravenous Every 24 hours 06/11/22 1034 06/11/22 1648   06/11/22 1200  linezolid (ZYVOX) IVPB 600 mg  Status:  Discontinued        600 mg 300 mL/hr over 60 Minutes Intravenous  Once 06/11/22 1047 06/11/22 1351   06/11/22 1100  metroNIDAZOLE (FLAGYL) IVPB 500 mg  Status:  Discontinued        500 mg 100 mL/hr over 60 Minutes Intravenous Every 12 hours 06/11/22 1006 06/13/22 1208   06/10/22 2330  cefTRIAXone (ROCEPHIN) 2 g in sodium chloride 0.9 % 100 mL IVPB  Status:  Discontinued        2 g 200 mL/hr over 30 Minutes Intravenous Every 24 hours 06/10/22 2329 06/11/22 1004   06/10/22 2330  azithromycin (ZITHROMAX) 500 mg in sodium chloride 0.9 % 250 mL IVPB  Status:  Discontinued        500 mg 250 mL/hr over 60 Minutes Intravenous Every 24 hours 06/10/22 2329 06/11/22 1004       Objective: Vitals:   06/19/22 0454 06/19/22 0857  BP: 113/82 115/72  Pulse: (!) 102 100  Resp: 18 18  Temp: 98.9 F (37.2 C) 98.5 F (36.9 C)  SpO2: 95% 100%    Intake/Output Summary (Last 24 hours) at 06/19/2022 1316 Last data filed at 06/19/2022 1023 Gross per 24 hour  Intake 1451.24 ml  Output 950 ml  Net 501.24 ml   Filed Weights   06/17/22 1814 06/18/22 0511 06/18/22 2117  Weight: 59.5 kg 59.8 kg 62 kg   Weight change: -0.1 kg Body mass index is 20.78 kg/m.   Physical Exam: General exam: Pleasant, middle-aged African-American male.  Not in pain. Skin: No rashes, lesions or ulcers. HEENT: Atraumatic, normocephalic, no obvious bleeding Lungs: Clear to auscultation bilaterally CVS: Regular rate and rhythm, no murmur GI/Abd soft, distended, mild tenderness present, bowel sound present CNS: Alert, awake, oriented x 3 Psychiatry: Mood appropriate Extremities: No pedal edema, no calf tenderness  Data Review: I have personally reviewed the laboratory data and studies  available.  F/u labs ordered Unresulted Labs (From admission, onward)     Start     Ordered   06/20/22 0500  CBC with Differential/Platelet  Tomorrow morning,   R       Question:  Specimen  collection method  Answer:  Unit=Unit collect   06/19/22 0834   06/19/22 0500  Renal function panel  Daily,   R     Question:  Specimen collection method  Answer:  Unit=Unit collect   06/18/22 1226            Total time spent in review of labs and imaging, patient evaluation, formulation of plan, documentation and communication with family - 49 minutes  Signed, Terrilee Croak, MD [facility] 06/19/2022

## 2022-06-20 ENCOUNTER — Encounter (HOSPITAL_COMMUNITY): Payer: Self-pay | Admitting: Cardiovascular Disease

## 2022-06-20 ENCOUNTER — Inpatient Hospital Stay (HOSPITAL_COMMUNITY): Payer: BLUE CROSS/BLUE SHIELD

## 2022-06-20 DIAGNOSIS — J9601 Acute respiratory failure with hypoxia: Secondary | ICD-10-CM | POA: Diagnosis not present

## 2022-06-20 LAB — CBC WITH DIFFERENTIAL/PLATELET
Abs Immature Granulocytes: 0.05 10*3/uL (ref 0.00–0.07)
Basophils Absolute: 0 10*3/uL (ref 0.0–0.1)
Basophils Relative: 0 %
Eosinophils Absolute: 0 10*3/uL (ref 0.0–0.5)
Eosinophils Relative: 0 %
HCT: 23.2 % — ABNORMAL LOW (ref 39.0–52.0)
Hemoglobin: 8.5 g/dL — ABNORMAL LOW (ref 13.0–17.0)
Immature Granulocytes: 1 %
Lymphocytes Relative: 13 %
Lymphs Abs: 0.8 10*3/uL (ref 0.7–4.0)
MCH: 34.4 pg — ABNORMAL HIGH (ref 26.0–34.0)
MCHC: 36.6 g/dL — ABNORMAL HIGH (ref 30.0–36.0)
MCV: 93.9 fL (ref 80.0–100.0)
Monocytes Absolute: 0.7 10*3/uL (ref 0.1–1.0)
Monocytes Relative: 11 %
Neutro Abs: 4.7 10*3/uL (ref 1.7–7.7)
Neutrophils Relative %: 75 %
Platelets: 274 10*3/uL (ref 150–400)
RBC: 2.47 MIL/uL — ABNORMAL LOW (ref 4.22–5.81)
RDW: 14.7 % (ref 11.5–15.5)
WBC: 6.2 10*3/uL (ref 4.0–10.5)
nRBC: 0 % (ref 0.0–0.2)

## 2022-06-20 LAB — RENAL FUNCTION PANEL
Albumin: 1.8 g/dL — ABNORMAL LOW (ref 3.5–5.0)
Anion gap: 12 (ref 5–15)
BUN: 75 mg/dL — ABNORMAL HIGH (ref 6–20)
CO2: 24 mmol/L (ref 22–32)
Calcium: 7.9 mg/dL — ABNORMAL LOW (ref 8.9–10.3)
Chloride: 100 mmol/L (ref 98–111)
Creatinine, Ser: 4.84 mg/dL — ABNORMAL HIGH (ref 0.61–1.24)
GFR, Estimated: 14 mL/min — ABNORMAL LOW (ref >60)
Glucose, Bld: 92 mg/dL (ref 70–99)
Phosphorus: 6 mg/dL — ABNORMAL HIGH (ref 2.5–4.6)
Potassium: 3.3 mmol/L — ABNORMAL LOW (ref 3.5–5.1)
Sodium: 136 mmol/L (ref 135–145)

## 2022-06-20 LAB — CULTURE, BLOOD (ROUTINE X 2)
Culture: NO GROWTH
Culture: NO GROWTH
Special Requests: ADEQUATE
Special Requests: ADEQUATE

## 2022-06-20 LAB — GLUCOSE, CAPILLARY
Glucose-Capillary: 86 mg/dL (ref 70–99)
Glucose-Capillary: 140 mg/dL — ABNORMAL HIGH (ref 70–99)
Glucose-Capillary: 139 mg/dL — ABNORMAL HIGH (ref 70–99)

## 2022-06-20 MED ORDER — ALUM & MAG HYDROXIDE-SIMETH 200-200-20 MG/5ML PO SUSP
15.0000 mL | Freq: Once | ORAL | Status: AC
  Administered 2022-06-20: 15 mL via ORAL
  Filled 2022-06-20: qty 30

## 2022-06-20 MED ORDER — PREDNISONE 20 MG PO TABS
20.0000 mg | ORAL_TABLET | Freq: Every day | ORAL | Status: DC
  Administered 2022-06-21 – 2022-06-22 (×2): 20 mg via ORAL
  Filled 2022-06-20 (×2): qty 1

## 2022-06-20 MED ORDER — GUAIFENESIN-DM 100-10 MG/5ML PO SYRP
5.0000 mL | ORAL_SOLUTION | ORAL | Status: DC | PRN
  Administered 2022-06-20 – 2022-06-23 (×6): 5 mL via ORAL
  Filled 2022-06-20 (×7): qty 5

## 2022-06-20 MED ORDER — POTASSIUM CHLORIDE CRYS ER 20 MEQ PO TBCR
40.0000 meq | EXTENDED_RELEASE_TABLET | Freq: Once | ORAL | Status: AC
  Administered 2022-06-20: 40 meq via ORAL
  Filled 2022-06-20: qty 2

## 2022-06-20 NOTE — Progress Notes (Signed)
PROGRESS NOTE  James [name]  DOB: February 24, 1971  PCP: Patient, No Pcp Per [id number]  DOA: 06/10/2022  LOS: 9 days  Hospital Day: 11  Brief narrative: James [name] is a 52 y.o. male with PMH significant for chronic alcoholism, chronic smoking, pancreatitis. 1/3, patient presented to [facility] ED with complaint of abdominal pain, shortness of breath In the ED, patient was found to be extremely short of breath, tachypneic, requiring high flow oxygen.   Workup revealed creatinine significantly elevated to 7.74 Chest x-ray showed right middle lobe and bibasilar pneumonia Patient was started on IV antibiotics, IV fluid for sepsis Admitted to [facility] Nephrology consultation was obtained. Workup included ANA/ANCA/GBM/complement levels all of which were unremarkable.   1/5, patient was started on CRRT 1/8, right IJ hemodialysis catheter was placed 1/10, transferred to [facility] for maintenance hemodialysis  Of note, blood cultures sent on admission showed Streptococcus pneumonia bacteremia in 1 out of 4 bottles TTE 1/8 was concerning for endocarditis on the anterior leaflet of mitral valve.  Cardiology was consulted for consideration of TEE See below for details  Subjective: Patient was seen and examined this morning. Lying on bed.  Not in distress.  Appetite improved. No nausea or vomiting after NG tube was pulled out yesterday Mental status improved.  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   Blood cultures sent on admission showed Streptococcus pneumonia bacteremia in 1 out of 4 bottles TTE 1/8 was concerning for endocarditis on the anterior leaflet of mitral valve.  Cardiology was consulted for consideration of TEE Repeat blood cultures sent on 1/8 does not show any growth so far. Currently on IV antibiotics No  recurrence of fever.  WBC count improved Recent Labs  Lab 06/14/22 0315 06/15/22 0244 06/16/22 0437 06/17/22 0426 06/20/22 0311  WBC 9.1 10.3 10.8* 12.4* 6.2   Acute kidney injury (AKI) Presented with creatinine significantly elevated to 7.74  1/5, patient was started on CRRT 1/8, right IJ hemodialysis catheter was placed 1/10, transferred to [facility] for maintenance hemodialysis Workup thus far negative including negative ANA's, ANCA, GBM, complement levels Nephrology following.  Patient continues to make urine output. Recent Labs    06/14/22 0315 06/14/22 1534 06/15/22 0244 06/15/22 0247 06/15/22 1543 06/16/22 0437 06/17/22 0426 06/18/22 0334 06/19/22 0846 06/20/22 0311  BUN 31* 24* 21* 24* 22* 35* 55* 37* 61* 75*  CREATININE 2.16* 1.82* 1.77* 1.89* 2.06* 3.26* 4.42* 3.42* 4.61* 4.84*   Acute anemia. Hemoglobin dropped by 2 points in last 48 hours.  Unclear cause.  No active bleeding.  Continue to monitor Recent Labs    06/11/22 1027 06/12/22 0304 06/14/22 0315 06/15/22 0244 06/16/22 0437 06/17/22 0426 06/20/22 0311  HGB  --    < > 11.0* 11.1* 10.3* 10.4* 8.5*  MCV  --    < > 96.3 97.6 95.5 96.7 93.9  VITAMINB12 287  --   --   --   --   --   --    < > = values in this interval not displayed.    Acute metabolic encephalopathy multifactorial secondary to infection, uremia TSH, vitamin B12, ammonia, drug screen, VBG unremarkable on 1/4 CT head 1/3 unremarkable  Mental status gradually improved.   Alcohol abuse Patient was managed with CIWA protocol for the first several days of hospitalization and has since been discontinued.   Protein-calorie malnutrition, moderate  Nutrition following,  their input is appreciated Boost nutritional supplements Assisting patient with all meals Daily multivitamin   Gouty arthritis of right great toe Identified 1/8 and confirmed via podiatry consultation at that time with Dr. Blenda Mounts Patient had been placed on prednisone  40 mg daily as of 1/9 taper   Nicotine dependence, cigarettes, uncomplicated Providing patient with daily nicotine patches   Abdominal ileus Improved with IV Reglan.  Currently tolerating regular diet.   Mobility: Encourage ambulation.  PT evaluation obtained.  SNF recommended  Goals of care   Code Status: Full Code    Scheduled Meds:  Chlorhexidine Gluconate Cloth  6 each Topical Q2200   Chlorhexidine Gluconate Cloth  6 each Topical Q0600   docusate sodium  100 mg Oral BID   feeding supplement (NEPRO CARB STEADY)  237 mL Oral BID BM   folic acid  1 mg Intravenous Daily   heparin  5,000 Units Subcutaneous Q8H   lactulose  20 g Oral Daily   metoCLOPramide (REGLAN) injection  5 mg Intravenous Q8H   multivitamin with minerals  1 tablet Oral Daily   nicotine  21 mg Transdermal Daily   mouth rinse  15 mL Mouth Rinse 4 times per day   pantoprazole  40 mg Oral Daily   polyethylene glycol  17 g Oral Daily   [START ON 06/21/2022] predniSONE  20 mg Oral Q breakfast   sodium chloride flush  10-40 mL Intracatheter Q12H    PRN meds: acetaminophen **OR** acetaminophen, guaiFENesin-dextromethorphan, hydrALAZINE, LORazepam, ondansetron **OR** ondansetron (ZOFRAN) IV, mouth rinse, sodium chloride flush   Infusions:   ampicillin (OMNIPEN) IV 2 g (06/20/22 0953)   thiamine (VITAMIN B1) injection 500 mg (06/20/22 1025)    Skin assessment:  Pressure Injury 06/14/22 Buttocks Right;Upper Stage 2 -  Partial thickness loss of dermis presenting as a shallow open injury with a red, pink wound bed without slough. Pea size (Active)  06/14/22 0500  Location: Buttocks  Location Orientation: Right;Upper  Staging: Stage 2 -  Partial thickness loss of dermis presenting as a shallow open injury with a red, pink wound bed without slough.  Wound Description (Comments): Pea size  Present on Admission:     Nutritional status:  Body mass index is 20.78 kg/m.  Nutrition Problem: Moderate  Malnutrition Etiology: chronic illness (alcohol abuse and pancreatitis) Signs/Symptoms: moderate fat depletion, moderate muscle depletion     Diet:  Diet Order             Diet renal with fluid restriction Fluid restriction: 1200 mL Fluid; Room service appropriate? Yes; Fluid consistency: Thin  Diet effective now                   DVT prophylaxis:  heparin injection 5,000 Units Start: 06/11/22 0800   Antimicrobials: IV ampicillin Fluid: None Consultants: Nephro, cardiology Family Communication: None at bedside  Status is: Inpatient  Continue in-hospital care because: On IV antibiotics Level of care: Telemetry Medical   Dispo: The patient is from: Home              Anticipated d/c is to: Pending clinical course              Patient currently is not medically stable to d/c.   Difficult to place patient No    Antimicrobials: Anti-infectives (From admission, onward)    Start     Dose/Rate Route Frequency Ordered Stop   06/16/22 1000  ampicillin (OMNIPEN) 2 g in sodium chloride 0.9 % 100 mL IVPB  2 g 300 mL/hr over 20 Minutes Intravenous Every 8 hours 06/16/22 0808     06/15/22 1500  ampicillin (OMNIPEN) 2 g in sodium chloride 0.9 % 100 mL IVPB  Status:  Discontinued        2 g 300 mL/hr over 20 Minutes Intravenous Every 6 hours 06/15/22 1404 06/16/22 0808   06/13/22 2200  ampicillin (OMNIPEN) 2 g in sodium chloride 0.9 % 100 mL IVPB  Status:  Discontinued        2 g 300 mL/hr over 20 Minutes Intravenous Every 8 hours 06/13/22 1208 06/15/22 1404   06/12/22 0800  cefTRIAXone (ROCEPHIN) 2 g in sodium chloride 0.9 % 100 mL IVPB  Status:  Discontinued        2 g 200 mL/hr over 30 Minutes Intravenous Every 24 hours 06/11/22 1650 06/13/22 1208   06/11/22 2200  linezolid (ZYVOX) IVPB 600 mg  Status:  Discontinued        600 mg 300 mL/hr over 60 Minutes Intravenous Every 12 hours 06/11/22 1100 06/11/22 1307   06/11/22 1200  ceFEPIme (MAXIPIME) 1 g in sodium  chloride 0.9 % 100 mL IVPB  Status:  Discontinued        1 g 200 mL/hr over 30 Minutes Intravenous Every 24 hours 06/11/22 1034 06/11/22 1648   06/11/22 1200  linezolid (ZYVOX) IVPB 600 mg  Status:  Discontinued        600 mg 300 mL/hr over 60 Minutes Intravenous  Once 06/11/22 1047 06/11/22 1351   06/11/22 1100  metroNIDAZOLE (FLAGYL) IVPB 500 mg  Status:  Discontinued        500 mg 100 mL/hr over 60 Minutes Intravenous Every 12 hours 06/11/22 1006 06/13/22 1208   06/10/22 2330  cefTRIAXone (ROCEPHIN) 2 g in sodium chloride 0.9 % 100 mL IVPB  Status:  Discontinued        2 g 200 mL/hr over 30 Minutes Intravenous Every 24 hours 06/10/22 2329 06/11/22 1004   06/10/22 2330  azithromycin (ZITHROMAX) 500 mg in sodium chloride 0.9 % 250 mL IVPB  Status:  Discontinued        500 mg 250 mL/hr over 60 Minutes Intravenous Every 24 hours 06/10/22 2329 06/11/22 1004       Objective: Vitals:   06/20/22 0916 06/20/22 1036  BP: 116/68 120/76  Pulse: 100 100  Resp: 18 18  Temp: 98 F (36.7 C) 98.4 F (36.9 C)  SpO2: 95% 96%    Intake/Output Summary (Last 24 hours) at 06/20/2022 1400 Last data filed at 06/20/2022 1243 Gross per 24 hour  Intake 718.34 ml  Output 850 ml  Net -131.66 ml   Filed Weights   06/18/22 0511 06/18/22 2117 06/19/22 2026  Weight: 59.8 kg 62 kg 62 kg   Weight change: 0 kg Body mass index is 20.78 kg/m.   Physical Exam: General exam: Pleasant, middle-aged African-American male.  Not in pain. Skin: No rashes, lesions or ulcers. HEENT: Atraumatic, normocephalic, no obvious bleeding Lungs: Clear to auscultation bilaterally CVS: Regular rate and rhythm, no murmur GI/Abd soft, improving distention, mild tenderness present, bowel sound present CNS: Alert, awake, oriented x 3 Psychiatry: Mood appropriate Extremities: No pedal edema, no calf tenderness  Data Review: I have personally reviewed the laboratory data and studies available.  F/u labs ordered Unresulted  Labs (From admission, onward)     Start     Ordered   06/21/22 0500  CBC with Differential/Platelet  Tomorrow morning,   R  Question:  Specimen collection method  Answer:  IV Team=IV Team collect   06/20/22 1400   06/21/22 0500  Basic metabolic panel  Tomorrow morning,   R       Question:  Specimen collection method  Answer:  IV Team=IV Team collect   06/20/22 1400            Total time spent in review of labs and imaging, patient evaluation, formulation of plan, documentation and communication with family 45 minutes  Signed, Lorin Glass, MD [facility] 06/20/2022

## 2022-06-20 NOTE — Progress Notes (Addendum)
[facility] Progress Note  Subjective: DTO 89 yest and [age] today. Creat up slightly at 4.8 today.   Vitals:   06/19/22 0454 06/19/22 0857 06/19/22 2026 06/20/22 0432  BP: 113/82 115/72 116/77 115/75  Pulse: (!) 102 100 94 99  Resp: 18 18 18 18   Temp: 98.9 F (37.2 C) 98.5 F (36.9 C) 98.4 F (36.9 C) 98.5 F (36.9 C)  TempSrc: Oral Oral Oral Oral  SpO2: 95% 100% 100% 100%  Weight:   62 kg   Height:        Exam: General adult male in bed Lungs breathing comfortably; on RA Heart S1s2 no rub tachycardic Abdomen soft, no ascites Extremities no edema lower extremities  Neuro more oriented and responsive today Access RIJ nontunneled catheter in place    UA cloudy, large Hb, 100 prot, >50rbc, 21-50 wbc/ epis  UNa 69, UCr 101   CXR - 1/08, no edema, R basilar process  Assessment/ Plan: AKI - suspect secondary to prolonged pre-renal insults leading to ATN in setting of alcohol abuse and severe pneumonia.Marland Kitchen required pressors on two different occasions for 48 hrs or less. Now is normotensive. 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  Serologies were all wnl (ANA, ANCA, GBM and C3/C4). Patient got CRRT 1/05 - 1/08.  IVF"s started 1/08 for low CVP. Pt moved to [facility] and had iHD once here on 1/10. Pt was given IV albumin and lasix x 1 yesterday. UOP much improved over the last 72 hrs, and creat stable today mid 4s. He is eating and drinking. Foley cath is out. No need for HD today. Appears to be recovering. Cont supportive care.  Acute resp failure/ vol overload - last HD 1/10 w/ 3 L UF.  Resp issues resolved. Euvolemic on exam now, no IVF's and no diuretics at this time.  Multifocal/ strep pneumoniae w/ bacteremia - antibiotics per primary team.   AMS - multifactorial w/ AKI, alcohol abuse . Much better.  Alcohol abuse/ ^LFT's - withdrawal protocol per primary team   Rob Temiloluwa [place] 06/20/2022, 7:46 AM   Recent Labs  Lab 06/17/22 0426 06/18/22 0334  06/19/22 0846 06/20/22 0311  HGB 10.4*  --   --  8.5*  ALBUMIN 1.8*   < > 1.7* 1.8*  CALCIUM 8.3*   < > 8.1* 7.9*  PHOS 6.9*   < > 5.3* 6.0*  CREATININE 4.42*   < > 4.61* 4.84*  K 4.2   < > 3.6 3.3*   < > = values in this interval not displayed.    No results for input(s): "IRON", "TIBC", "FERRITIN" in the last 168 hours. Inpatient medications:  Chlorhexidine Gluconate Cloth  6 each Topical Q2200   Chlorhexidine Gluconate Cloth  6 each Topical Q0600   docusate sodium  100 mg Oral BID   feeding supplement (NEPRO CARB STEADY)  237 mL Oral BID BM   folic acid  1 mg Intravenous Daily   heparin  5,000 Units Subcutaneous Q8H   lactulose  20 g Oral Daily   metoCLOPramide (REGLAN) injection  5 mg Intravenous Q8H   multivitamin with minerals  1 tablet Oral Daily   nicotine  21 mg Transdermal Daily   mouth rinse  15 mL Mouth Rinse 4 times per day   pantoprazole  40 mg Oral Daily   polyethylene glycol  17 g Oral Daily   predniSONE  40 mg Oral Q breakfast   sodium chloride flush  10-40 mL Intracatheter Q12H  ampicillin (OMNIPEN) IV Stopped (06/20/22 0240)   thiamine (VITAMIN B1) injection 500 mg (06/19/22 1029)   acetaminophen **OR** acetaminophen, hydrALAZINE, LORazepam, ondansetron **OR** ondansetron (ZOFRAN) IV, mouth rinse, sodium chloride flush

## 2022-06-20 NOTE — Progress Notes (Signed)
Subjective:  Patient denies any chest pain coughing and breathing has improved.  No active cardiac issues at this time  Objective:  Vital Signs in the last 24 hours: Temp:  [98 F (36.7 C)-98.5 F (36.9 C)] 98.4 F (36.9 C) (01/13 1036) Pulse Rate:  [94-100] 100 (01/13 1036) Resp:  [18] 18 (01/13 1036) BP: (115-120)/(68-77) 120/76 (01/13 1036) SpO2:  [95 %-100 %] 96 % (01/13 1036) Weight:  [62 kg] 62 kg (01/12 2026)  Intake/Output from previous day: 01/12 0701 - 01/13 0700 In: 868.3 [P.O.:480; IV Piggyback:388.3] Out: 1050 [Urine:1050] Intake/Output from this shift: Total I/O In: 120 [P.O.:120] Out: 0   Physical Exam: Neck: no adenopathy, no carotid bruit, no JVD, and supple, symmetrical, trachea midline Lungs: Decreased breath sound at bases with occasional rhonchi Heart: regular rate and rhythm, S1, S2 normal, and soft systolic murmur noted no S3 gallop Abdomen: soft, non-tender; bowel sounds normal; no masses,  no organomegaly Extremities: extremities normal, atraumatic, no cyanosis or edema  Lab Results: Recent Labs    06/20/22 0311  WBC 6.2  HGB 8.5*  PLT 274   Recent Labs    06/19/22 0846 06/20/22 0311  NA 136 136  K 3.6 3.3*  CL 98 100  CO2 26 24  GLUCOSE 128* 92  BUN 61* 75*  CREATININE 4.61* 4.84*   No results for input(s): "TROPONINI" in the last 72 hours.  Invalid input(s): "CK", "MB" Hepatic Function Panel Recent Labs    06/20/22 0311  ALBUMIN 1.8*   No results for input(s): "CHOL" in the last 72 hours. No results for input(s): "PROTIME" in the last 72 hours.  Imaging: Imaging results have been reviewed and US Abdomen Limited RUQ (LIVER/GB)  Result Date: 06/20/2022 CLINICAL DATA:  Abdominal distension EXAM: ULTRASOUND ABDOMEN LIMITED RIGHT UPPER QUADRANT COMPARISON:  None Available. FINDINGS: Gallbladder: The gallbladder wall is prominent measuring 3.2 mm in thickness. The gallbladder is otherwise normal. Common bile duct: Diameter: 3 mm  Liver: Diffuse increased echogenicity throughout the liver. No focal mass. Portal vein is patent on color Doppler imaging with normal direction of blood flow towards the liver. Other: Right pleural effusion.  Mild ascites. IMPRESSION: 1. Diffuse increased echogenicity throughout the liver is nonspecific but often due to hepatic steatosis. 2. The gallbladder wall is prominent measuring 3.2 mm. The gallbladder is otherwise normal. 3. The common bile duct is normal. 4. Right pleural effusion. 5. Mild ascites. Electronically Signed   By: Dorise Bullion III M.D.   On: 06/20/2022 09:17   ECHO TEE  Result Date: 06/19/2022    TRANSESOPHOGEAL ECHO REPORT   Patient Name:   James [name] [facility] Date of Exam: 06/18/2022 Medical Rec #:  [id number]           Height:       68.0 in Accession #:    [id number]          Weight:       131.8 lb Date of Birth:  August 07, 1970           BSA:          1.712 m Patient Age:    52 years            BP:           118/75 mmHg Patient Gender: M                   HR:           87 bpm. Exam Location:  Inpatient Procedure: Transesophageal Echo, 3D Echo, Cardiac Doppler and Color Doppler Indications:     Bacteremia  History:         Patient has prior history of Echocardiogram examinations, most                  recent 06/15/2022. Kidney disease; Signs/Symptoms:Dyspnea.  Sonographer:     Delcie Roch RDCS Referring Phys:  [id number] Orpah Cobb Diagnosing Phys: Orpah Cobb MD PROCEDURE: After discussion of the risks and benefits of a TEE, an informed consent was obtained from a family member. The patient was intubated. The transesophogeal probe was passed without difficulty through the esophogus of the patient. Sedation performed by different physician. The patient developed no complications during the procedure. Intubation and general anesthesia Post procedure successful extubation.  IMPRESSIONS  1. Left ventricular ejection fraction, by estimation, is 55 to 60%. The left ventricle has normal function.  The left ventricle has no regional wall motion abnormalities.  2. Right ventricular systolic function is mildly reduced. The right ventricular size is mildly enlarged.  3. Left atrial size was mildly dilated. No left atrial/left atrial appendage thrombus was detected.  4. Right atrial size was mildly dilated.  5. There is no evidence of cardiac tamponade.  6. The mitral valve is myxomatous. Trivial mitral valve regurgitation.  7. The aortic valve is tricuspid. Aortic valve regurgitation is not visualized.  8. The inferior vena cava is normal in size with greater than 50% respiratory variability, suggesting right atrial pressure of 3 mmHg.  9. Agitated saline contrast bubble study was positive with shunting observed after >6 cardiac cycles suggestive of intrapulmonary shunting. Conclusion(s)/Recommendation(s): No evidence of vegetation/infective endocarditis on this transesophageael echocardiogram. FINDINGS  Left Ventricle: Left ventricular ejection fraction, by estimation, is 55 to 60%. The left ventricle has normal function. The left ventricle has no regional wall motion abnormalities. The left ventricular internal cavity size was normal in size. Right Ventricle: The right ventricular size is mildly enlarged. No increase in right ventricular wall thickness. Right ventricular systolic function is mildly reduced. Left Atrium: Left atrial size was mildly dilated. No left atrial/left atrial appendage thrombus was detected. Right Atrium: Right atrial size was mildly dilated. Pericardium: Trivial pericardial effusion is present. The pericardial effusion is circumferential. There is no evidence of cardiac tamponade. Mitral Valve: The mitral valve is myxomatous. Trivial mitral valve regurgitation. Tricuspid Valve: The tricuspid valve is normal in structure. Tricuspid valve regurgitation is trivial. Aortic Valve: The aortic valve is tricuspid. Aortic valve regurgitation is not visualized. Pulmonic Valve: The pulmonic valve  was normal in structure. Pulmonic valve regurgitation is not visualized. Aorta: The aortic root is normal in size and structure. There is minimal (Grade I) atheroma plaque involving the descending aorta. Venous: The left upper pulmonary vein, left lower pulmonary vein, right upper pulmonary vein and right lower pulmonary vein are normal. The inferior vena cava is normal in size with greater than 50% respiratory variability, suggesting right atrial pressure of 3 mmHg. IAS/Shunts: No atrial level shunt detected by color flow Doppler. Agitated saline contrast was given intravenously to evaluate for intracardiac shunting. Agitated saline contrast bubble study was positive with shunting observed after >6 cardiac cycles suggestive of intrapulmonary shunting. There is no evidence of a patent foramen ovale. Additional Comments: Mild ascites is present. Orpah Cobb MD Electronically signed by Orpah Cobb MD Signature Date/Time: 06/19/2022/12:17:41 PM    Final     Cardiac Studies:  Assessment/Plan:  Resolving multifocal pneumonia Resolving strep bacteremia, endocarditis ruled  out. Alcohol use disorder Tobacco use disorder Gout Acute on chronic kidney disease Severe hypoalbuminemia Plan Continue present management per PMD and renal service I will sign off please call if needed  LOS: 9 days    Charolette Forward 06/20/2022, 11:19 AM

## 2022-06-21 DIAGNOSIS — J9601 Acute respiratory failure with hypoxia: Secondary | ICD-10-CM | POA: Diagnosis not present

## 2022-06-21 LAB — BASIC METABOLIC PANEL
Anion gap: 12 (ref 5–15)
BUN: 73 mg/dL — ABNORMAL HIGH (ref 6–20)
CO2: 24 mmol/L (ref 22–32)
Calcium: 8.1 mg/dL — ABNORMAL LOW (ref 8.9–10.3)
Chloride: 103 mmol/L (ref 98–111)
Creatinine, Ser: 4.71 mg/dL — ABNORMAL HIGH (ref 0.61–1.24)
GFR, Estimated: 14 mL/min — ABNORMAL LOW (ref >60)
Glucose, Bld: 104 mg/dL — ABNORMAL HIGH (ref 70–99)
Potassium: 3.5 mmol/L (ref 3.5–5.1)
Sodium: 139 mmol/L (ref 135–145)

## 2022-06-21 LAB — CBC WITH DIFFERENTIAL/PLATELET
Abs Immature Granulocytes: 0.05 10*3/uL (ref 0.00–0.07)
Basophils Absolute: 0 10*3/uL (ref 0.0–0.1)
Basophils Relative: 0 %
Eosinophils Absolute: 0 10*3/uL (ref 0.0–0.5)
Eosinophils Relative: 0 %
HCT: 22.9 % — ABNORMAL LOW (ref 39.0–52.0)
Hemoglobin: 8.3 g/dL — ABNORMAL LOW (ref 13.0–17.0)
Immature Granulocytes: 1 %
Lymphocytes Relative: 10 %
Lymphs Abs: 0.7 10*3/uL (ref 0.7–4.0)
MCH: 34.6 pg — ABNORMAL HIGH (ref 26.0–34.0)
MCHC: 36.2 g/dL — ABNORMAL HIGH (ref 30.0–36.0)
MCV: 95.4 fL (ref 80.0–100.0)
Monocytes Absolute: 0.6 10*3/uL (ref 0.1–1.0)
Monocytes Relative: 9 %
Neutro Abs: 5.6 10*3/uL (ref 1.7–7.7)
Neutrophils Relative %: 80 %
Platelets: 315 10*3/uL (ref 150–400)
RBC: 2.4 MIL/uL — ABNORMAL LOW (ref 4.22–5.81)
RDW: 14.5 % (ref 11.5–15.5)
WBC: 7 10*3/uL (ref 4.0–10.5)
nRBC: 0 % (ref 0.0–0.2)

## 2022-06-21 LAB — GLUCOSE, CAPILLARY
Glucose-Capillary: 142 mg/dL — ABNORMAL HIGH (ref 70–99)
Glucose-Capillary: 96 mg/dL (ref 70–99)
Glucose-Capillary: 130 mg/dL — ABNORMAL HIGH (ref 70–99)
Glucose-Capillary: 128 mg/dL — ABNORMAL HIGH (ref 70–99)

## 2022-06-21 NOTE — Progress Notes (Signed)
[name] Progress Note  Subjective: creat down today, UOP down also but suspect is not accurate due to removal of foley cath.   Vitals:   06/20/22 1036 06/20/22 1644 06/20/22 2108 06/21/22 0526  BP: 120/76 113/73 110/76 124/79  Pulse: 100 90 90 79  Resp: 18 18 18 18   Temp: 98.4 F (36.9 C) 98.6 F (37 C) 98.2 F (36.8 C) (!) 97.5 F (36.4 C)  TempSrc: Oral  Oral Oral  SpO2: 96% 98% 98% 99%  Weight:   62 kg   Height:        Exam: General adult male in bed Lungs breathing comfortably; on RA Heart S1s2 no rub tachycardic Abdomen soft, no ascites Extremities no edema lower extremities  Neuro more oriented and responsive today Access RIJ nontunneled catheter in place    UA cloudy, large Hb, 100 prot, >50rbc, 21-50 wbc/ epis  UNa 69, UCr 101   CXR - 1/08, no edema, R basilar process  Assessment/ Plan: AKI - suspect secondary to prolonged pre-renal insults leading to ATN in setting of alcohol abuse and severe pneumonia.Marland Kitchen required pressors on two different occasions for 48 hrs or less. Now is normotensive. 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  Serologies were all wnl (ANA, ANCA, GBM and C3/C4). Patient got CRRT 1/05 - 1/08.  IVF"s started 1/08 for low CVP/ dry on exam. Pt moved to [facility] and had iHD once here on 1/10 for acute SOB as per #2 below. Also pt was given IV albumin and lasix x 1 on 1/12. UOP overall has returned and creat peaked yest and is down slightly today in the mid 4s. Foley cath is out. Appears to be recovering. No HD needs at this time, HD cath can be removed soon if renal function continues to recover.  Acute resp failure/ vol overload - last HD 1/10 w/ 3 L UF.  Resp issues resolved. Euvolemic on exam now.  Multifocal/ strep pneumoniae w/ bacteremia - IV ampicillin per primary team.   AMS - multifactorial w/ AKI, alcohol abuse . Improving daily. Alcohol abuse/ ^LFT's - withdrawal protocol per primary team   James [name]  James [name] 06/21/2022, 7:11 AM   Recent Labs  Lab 06/19/22 0846 06/20/22 0311 06/21/22 0410  HGB  --  8.5* 8.3*  ALBUMIN 1.7* 1.8*  --   CALCIUM 8.1* 7.9* 8.1*  PHOS 5.3* 6.0*  --   CREATININE 4.61* 4.84* 4.71*  K 3.6 3.3* 3.5    No results for input(s): "IRON", "TIBC", "FERRITIN" in the last 168 hours. Inpatient medications:  Chlorhexidine Gluconate Cloth  6 each Topical Q2200   Chlorhexidine Gluconate Cloth  6 each Topical Q0600   docusate sodium  100 mg Oral BID   feeding supplement (NEPRO CARB STEADY)  237 mL Oral BID BM   folic acid  1 mg Intravenous Daily   heparin  5,000 Units Subcutaneous Q8H   lactulose  20 g Oral Daily   multivitamin with minerals  1 tablet Oral Daily   nicotine  21 mg Transdermal Daily   mouth rinse  15 mL Mouth Rinse 4 times per day   pantoprazole  40 mg Oral Daily   polyethylene glycol  17 g Oral Daily   predniSONE  20 mg Oral Q breakfast   sodium chloride flush  10-40 mL Intracatheter Q12H    ampicillin (OMNIPEN) IV 2 g (06/21/22 0154)   thiamine (VITAMIN B1) injection Stopped (06/20/22 1055)   acetaminophen **OR** acetaminophen, guaiFENesin-dextromethorphan, hydrALAZINE, LORazepam,  ondansetron **OR** ondansetron (ZOFRAN) IV, mouth rinse, sodium chloride flush

## 2022-06-21 NOTE — Progress Notes (Signed)
PROGRESS NOTE  James [name]  DOB: 06-24-1970  PCP: Patient, No Pcp Per RSW:[id number]  DOA: 06/10/2022  LOS: 10 days  Hospital Day: 12  Brief narrative: James [name] is a 52 y.o. male with PMH significant for chronic alcoholism, chronic smoking, pancreatitis. 1/3, patient presented to [facility] ED with complaint of abdominal pain, shortness of breath In the ED, patient was found to be extremely short of breath, tachypneic, requiring high flow oxygen.   Workup revealed creatinine significantly elevated to 7.74 Chest x-ray showed right middle lobe and bibasilar pneumonia Patient was started on IV antibiotics, IV fluid for sepsis Admitted to [facility] Nephrology consultation was obtained. Workup included ANA/ANCA/GBM/complement levels all of which were unremarkable.   1/5, patient was started on CRRT 1/8, right IJ hemodialysis catheter was placed 1/10, transferred to [facility] for maintenance hemodialysis  Of note, blood cultures sent on admission showed Streptococcus pneumonia bacteremia in 1 out of 4 bottles TTE 1/8 was concerning for endocarditis on the anterior leaflet of mitral valve.  Cardiology was consulted for consideration of TEE See below for details  Subjective: Patient was seen and examined this morning. Lying on bed.  Not in distress.  No new symptoms.  Feels better.  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   Blood cultures sent on admission showed Streptococcus pneumonia bacteremia in 1 out of 4 bottles TTE 1/8 was concerning for endocarditis on the anterior leaflet of mitral valve.  Cardiology was consulted for consideration of TEE Repeat blood cultures sent on 1/8 does not show any growth so far. Currently on IV antibiotics.  Will discuss with ID regarding the need and duration of IV versus oral  antibiotics at discharge No recurrence of fever.  WBC count improved Recent Labs  Lab 06/15/22 0244 06/16/22 0437 06/17/22 0426 06/20/22 0311 06/21/22 0410  WBC 10.3 10.8* 12.4* 6.2 7.0    Acute kidney injury (AKI) Presented with creatinine significantly elevated to 7.74  1/5, patient was started on CRRT 1/8, right IJ hemodialysis catheter was placed 1/10, transferred to [facility] for maintenance hemodialysis Workup thus far negative including negative ANA's, ANCA, GBM, complement levels Nephrology following.  Patient continues to make urine output. Probably will not need long-term dialysis. Recent Labs    06/14/22 1534 06/15/22 0244 06/15/22 0247 06/15/22 1543 06/16/22 0437 06/17/22 0426 06/18/22 0334 06/19/22 0846 06/20/22 0311 06/21/22 0410  BUN 24* 21* 24* 22* 35* 55* 37* 61* 75* 73*  CREATININE 1.82* 1.77* 1.89* 2.06* 3.26* 4.42* 3.42* 4.61* 4.84* 4.71*    Acute anemia. Hemoglobin dropped by 2 points in last 48 hours.  Unclear cause.  No active bleeding.  Continue to monitor Recent Labs    06/11/22 1027 06/12/22 0304 06/15/22 0244 06/16/22 0437 06/17/22 0426 06/20/22 0311 06/21/22 0410  HGB  --    < > 11.1* 10.3* 10.4* 8.5* 8.3*  MCV  --    < > 97.6 95.5 96.7 93.9 95.4  VITAMINB12 287  --   --   --   --   --   --    < > = values in this interval not displayed.     Acute metabolic encephalopathy multifactorial secondary to infection, uremia TSH, vitamin B12, ammonia, drug screen, VBG unremarkable on 1/4 CT head 1/3 unremarkable  Mental status gradually improved.   Alcohol abuse Patient was managed with CIWA protocol for the first several  days of hospitalization and has since been discontinued. 1/13, ultrasound right upper quadrant did not show any evidence of cirrhosis or ascites.  Abdominal distention improved.   Protein-calorie malnutrition, moderate  Nutrition following, their input is appreciated Boost nutritional supplements Assisting  patient with all meals Daily multivitamin   Gouty arthritis of right great toe Identified 1/8 and confirmed via podiatry consultation at that time with Dr. Blenda Mounts Patient was started on prednisone tapering course.  Continue to taper.   Nicotine dependence, cigarettes, uncomplicated Providing patient with daily nicotine patches   Abdominal ileus Improved with IV Reglan.  Currently tolerating regular diet.   Mobility: Encourage ambulation.  PT evaluation obtained.  SNF recommended  Goals of care   Code Status: Full Code    Scheduled Meds:  Chlorhexidine Gluconate Cloth  6 each Topical Q2200   Chlorhexidine Gluconate Cloth  6 each Topical Q0600   docusate sodium  100 mg Oral BID   feeding supplement (NEPRO CARB STEADY)  237 mL Oral BID BM   folic acid  1 mg Intravenous Daily   heparin  5,000 Units Subcutaneous Q8H   lactulose  20 g Oral Daily   multivitamin with minerals  1 tablet Oral Daily   nicotine  21 mg Transdermal Daily   mouth rinse  15 mL Mouth Rinse 4 times per day   pantoprazole  40 mg Oral Daily   polyethylene glycol  17 g Oral Daily   predniSONE  20 mg Oral Q breakfast   sodium chloride flush  10-40 mL Intracatheter Q12H    PRN meds: acetaminophen **OR** acetaminophen, guaiFENesin-dextromethorphan, hydrALAZINE, LORazepam, ondansetron **OR** ondansetron (ZOFRAN) IV, mouth rinse, sodium chloride flush   Infusions:   ampicillin (OMNIPEN) IV 2 g (06/21/22 0945)   thiamine (VITAMIN B1) injection 500 mg (06/21/22 0841)    Skin assessment:  Pressure Injury 06/14/22 Buttocks Right;Upper Stage 2 -  Partial thickness loss of dermis presenting as a shallow open injury with a red, pink wound bed without slough. Pea size (Active)  06/14/22 0500  Location: Buttocks  Location Orientation: Right;Upper  Staging: Stage 2 -  Partial thickness loss of dermis presenting as a shallow open injury with a red, pink wound bed without slough.  Wound Description (Comments): Pea size   Present on Admission:     Nutritional status:  Body mass index is 20.78 kg/m.  Nutrition Problem: Moderate Malnutrition Etiology: chronic illness (alcohol abuse and pancreatitis) Signs/Symptoms: moderate fat depletion, moderate muscle depletion     Diet:  Diet Order             Diet renal with fluid restriction Fluid restriction: 1200 mL Fluid; Room service appropriate? Yes; Fluid consistency: Thin  Diet effective now                   DVT prophylaxis:  heparin injection 5,000 Units Start: 06/11/22 0800   Antimicrobials: IV ampicillin Fluid: None Consultants: Nephro, cardiology Family Communication: None at bedside  Status is: Inpatient  Continue in-hospital care because: On IV antibiotics Level of care: Telemetry Medical   Dispo: The patient is from: Home              Anticipated d/c is to: Pending clinical course              Patient currently is not medically stable to d/c.   Difficult to place patient No    Antimicrobials: Anti-infectives (From admission, onward)    Start     Dose/Rate Route Frequency  Ordered Stop   06/16/22 1000  ampicillin (OMNIPEN) 2 g in sodium chloride 0.9 % 100 mL IVPB        2 g 300 mL/hr over 20 Minutes Intravenous Every 8 hours 06/16/22 0808     06/15/22 1500  ampicillin (OMNIPEN) 2 g in sodium chloride 0.9 % 100 mL IVPB  Status:  Discontinued        2 g 300 mL/hr over 20 Minutes Intravenous Every 6 hours 06/15/22 1404 06/16/22 0808   06/13/22 2200  ampicillin (OMNIPEN) 2 g in sodium chloride 0.9 % 100 mL IVPB  Status:  Discontinued        2 g 300 mL/hr over 20 Minutes Intravenous Every 8 hours 06/13/22 1208 06/15/22 1404   06/12/22 0800  cefTRIAXone (ROCEPHIN) 2 g in sodium chloride 0.9 % 100 mL IVPB  Status:  Discontinued        2 g 200 mL/hr over 30 Minutes Intravenous Every 24 hours 06/11/22 1650 06/13/22 1208   06/11/22 2200  linezolid (ZYVOX) IVPB 600 mg  Status:  Discontinued        600 mg 300 mL/hr over 60 Minutes  Intravenous Every 12 hours 06/11/22 1100 06/11/22 1307   06/11/22 1200  ceFEPIme (MAXIPIME) 1 g in sodium chloride 0.9 % 100 mL IVPB  Status:  Discontinued        1 g 200 mL/hr over 30 Minutes Intravenous Every 24 hours 06/11/22 1034 06/11/22 1648   06/11/22 1200  linezolid (ZYVOX) IVPB 600 mg  Status:  Discontinued        600 mg 300 mL/hr over 60 Minutes Intravenous  Once 06/11/22 1047 06/11/22 1351   06/11/22 1100  metroNIDAZOLE (FLAGYL) IVPB 500 mg  Status:  Discontinued        500 mg 100 mL/hr over 60 Minutes Intravenous Every 12 hours 06/11/22 1006 06/13/22 1208   06/10/22 2330  cefTRIAXone (ROCEPHIN) 2 g in sodium chloride 0.9 % 100 mL IVPB  Status:  Discontinued        2 g 200 mL/hr over 30 Minutes Intravenous Every 24 hours 06/10/22 2329 06/11/22 1004   06/10/22 2330  azithromycin (ZITHROMAX) 500 mg in sodium chloride 0.9 % 250 mL IVPB  Status:  Discontinued        500 mg 250 mL/hr over 60 Minutes Intravenous Every 24 hours 06/10/22 2329 06/11/22 1004       Objective: Vitals:   06/20/22 2108 06/21/22 0526  BP: 110/76 124/79  Pulse: 90 79  Resp: 18 18  Temp: 98.2 F (36.8 C) (!) 97.5 F (36.4 C)  SpO2: 98% 99%    Intake/Output Summary (Last 24 hours) at 06/21/2022 1024 Last data filed at 06/21/2022 0849 Gross per 24 hour  Intake 1350 ml  Output 675 ml  Net 675 ml    Filed Weights   06/18/22 2117 06/19/22 2026 06/20/22 2108  Weight: 62 kg 62 kg 62 kg   Weight change: 0 kg Body mass index is 20.78 kg/m.   Physical Exam: General exam: Pleasant, middle-aged African-American male.  Not in pain. Skin: No rashes, lesions or ulcers. HEENT: Atraumatic, normocephalic, no obvious bleeding Lungs: Clear to auscultation bilaterally CVS: Regular rate and rhythm, no murmur GI/Abd soft, distended and tenderness improved, bowel sound present CNS: Alert, awake, oriented x 3 Psychiatry: Mood appropriate Extremities: No pedal edema, no calf tenderness  Data Review: I have  personally reviewed the laboratory data and studies available.  F/u labs ordered Unresulted Labs (From admission, onward)  None       Total time spent in review of labs and imaging, patient evaluation, formulation of plan, documentation and communication with family 45 minutes  Signed, Lorin Glass, MD [facility] 06/21/2022

## 2022-06-22 DIAGNOSIS — J9601 Acute respiratory failure with hypoxia: Secondary | ICD-10-CM | POA: Diagnosis not present

## 2022-06-22 LAB — BASIC METABOLIC PANEL
Anion gap: 9 (ref 5–15)
BUN: 66 mg/dL — ABNORMAL HIGH (ref 6–20)
CO2: 25 mmol/L (ref 22–32)
Calcium: 8.1 mg/dL — ABNORMAL LOW (ref 8.9–10.3)
Chloride: 104 mmol/L (ref 98–111)
Creatinine, Ser: 4.13 mg/dL — ABNORMAL HIGH (ref 0.61–1.24)
GFR, Estimated: 16 mL/min — ABNORMAL LOW (ref >60)
Glucose, Bld: 126 mg/dL — ABNORMAL HIGH (ref 70–99)
Potassium: 3.4 mmol/L — ABNORMAL LOW (ref 3.5–5.1)
Sodium: 138 mmol/L (ref 135–145)

## 2022-06-22 LAB — GLUCOSE, CAPILLARY
Glucose-Capillary: 126 mg/dL — ABNORMAL HIGH (ref 70–99)
Glucose-Capillary: 180 mg/dL — ABNORMAL HIGH (ref 70–99)
Glucose-Capillary: 138 mg/dL — ABNORMAL HIGH (ref 70–99)
Glucose-Capillary: 96 mg/dL (ref 70–99)

## 2022-06-22 MED ORDER — PREDNISONE 10 MG PO TABS
10.0000 mg | ORAL_TABLET | Freq: Every day | ORAL | Status: DC
  Administered 2022-06-23 – 2022-06-25 (×3): 10 mg via ORAL
  Filled 2022-06-22 (×3): qty 1

## 2022-06-22 MED ORDER — POTASSIUM CHLORIDE CRYS ER 20 MEQ PO TBCR
40.0000 meq | EXTENDED_RELEASE_TABLET | Freq: Once | ORAL | Status: AC
  Administered 2022-06-22: 40 meq via ORAL
  Filled 2022-06-22: qty 2

## 2022-06-22 NOTE — Progress Notes (Signed)
Patient ID: James [name], male   DOB: 1971/03/16, 52 y.o.   MRN: [id number] S: Complaining of abdominal discomfort and loose bowel movement. O:BP 130/80 (BP Location: Right Arm)   Pulse 88   Temp 98 F (36.7 C) (Oral)   Resp 20   Ht 5\' 8"  (1.727 m)   Wt 62.4 kg   SpO2 96%   BMI 20.92 kg/m   Intake/Output Summary (Last 24 hours) at 06/22/2022 1321 Last data filed at 06/22/2022 1025 Gross per 24 hour  Intake 1348 ml  Output 1370 ml  Net -22 ml   Intake/Output: I/O last 3 completed shifts: In: 1698 [P.O.:1298; IV Piggyback:400] Out: 1450 [Urine:1450]  Intake/Output this shift:  Total I/O In: 370 [P.O.:360; I.V.:10] Out: 420 [Urine:420] Weight change: 0.4 kg Gen: NAD CVS: RRR Resp: CTA Abd: +BS, soft, NT/ND Ext:no edema  Recent Labs  Lab 06/16/22 0437 06/17/22 0426 06/18/22 0334 06/19/22 0846 06/20/22 0311 06/21/22 0410 06/22/22 0516  NA 138 140 139 136 136 139 138  K 3.9 4.2 3.6 3.6 3.3* 3.5 3.4*  CL 107 111 101 98 100 103 104  CO2 22 18* 26 26 24 24 25   GLUCOSE 104* 123* 99 128* 92 104* 126*  BUN 35* 55* 37* 61* 75* 73* 66*  CREATININE 3.26* 4.42* 3.42* 4.61* 4.84* 4.71* 4.13*  ALBUMIN 1.7* 1.8* 1.8* 1.7* 1.8*  --   --   CALCIUM 7.8* 8.3* 8.1* 8.1* 7.9* 8.1* 8.1*  PHOS 4.8* 6.9* 6.1* 5.3* 6.0*  --   --    Liver Function Tests: Recent Labs  Lab 06/18/22 0334 06/19/22 0846 06/20/22 0311  ALBUMIN 1.8* 1.7* 1.8*   No results for input(s): "LIPASE", "AMYLASE" in the last 168 hours. No results for input(s): "AMMONIA" in the last 168 hours. CBC: Recent Labs  Lab 06/16/22 0437 06/17/22 0426 06/20/22 0311 06/21/22 0410  WBC 10.8* 12.4* 6.2 7.0  NEUTROABS  --   --  4.7 5.6  HGB 10.3* 10.4* 8.5* 8.3*  HCT 29.8* 29.7* 23.2* 22.9*  MCV 95.5 96.7 93.9 95.4  PLT 164 241 274 315   Cardiac Enzymes: No results for input(s): "CKTOTAL", "CKMB", "CKMBINDEX", "TROPONINI" in the last 168 hours. CBG: Recent Labs  Lab 06/21/22 1125 06/21/22 1639  06/21/22 2149 06/22/22 0836 06/22/22 1105  GLUCAP 96 130* 128* 126* 180*    Iron Studies: No results for input(s): "IRON", "TIBC", "TRANSFERRIN", "FERRITIN" in the last 72 hours. Studies/Results: No results found.  Chlorhexidine Gluconate Cloth  6 each Topical Q2200   Chlorhexidine Gluconate Cloth  6 each Topical Q0600   docusate sodium  100 mg Oral BID   feeding supplement (NEPRO CARB STEADY)  237 mL Oral BID BM   folic acid  1 mg Intravenous Daily   heparin  5,000 Units Subcutaneous Q8H   lactulose  20 g Oral Daily   multivitamin with minerals  1 tablet Oral Daily   nicotine  21 mg Transdermal Daily   mouth rinse  15 mL Mouth Rinse 4 times per day   pantoprazole  40 mg Oral Daily   polyethylene glycol  17 g Oral Daily   [START ON 06/23/2022] predniSONE  10 mg Oral Q breakfast   sodium chloride flush  10-40 mL Intracatheter Q12H    BMET    Component Value Date/Time   NA 138 06/22/2022 0516   K 3.4 (L) 06/22/2022 0516   CL 104 06/22/2022 0516   CO2 25 06/22/2022 0516   GLUCOSE 126 (H) 06/22/2022 4627  BUN 66 (H) 06/22/2022 0516   CREATININE 4.13 (H) 06/22/2022 0516   CALCIUM 8.1 (L) 06/22/2022 0516   GFRNONAA 16 (L) 06/22/2022 0516   GFRAA  02/12/2010 0949    >60        The eGFR has been calculated using the MDRD equation. This calculation has not been validated in all clinical situations. eGFR's persistently <60 mL/min signify possible Chronic Kidney Disease.   CBC    Component Value Date/Time   WBC 7.0 06/21/2022 0410   RBC 2.40 (L) 06/21/2022 0410   HGB 8.3 (L) 06/21/2022 0410   HCT 22.9 (L) 06/21/2022 0410   PLT 315 06/21/2022 0410   MCV 95.4 06/21/2022 0410   MCH 34.6 (H) 06/21/2022 0410   MCHC 36.2 (H) 06/21/2022 0410   RDW 14.5 06/21/2022 0410   LYMPHSABS 0.7 06/21/2022 0410   MONOABS 0.6 06/21/2022 0410   EOSABS 0.0 06/21/2022 0410   BASOSABS 0.0 06/21/2022 0410   Assessment/ Plan: AKI - suspect secondary to prolonged pre-renal insults  leading to ATN in setting of alcohol abuse and severe pneumonia.Marland Kitchen required pressors on two different occasions for 48 hrs or less. Now is normotensive. 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  Serologies were all wnl (ANA, ANCA, GBM and C3/C4). Patient got CRRT 1/05 - 1/08.  IVF"s started 1/08 for low CVP/ dry on exam. Pt moved to [facility] and had iHD once here on 1/10 for acute SOB as per #2 below. Also pt was given IV albumin and lasix x 1 on 1/12. UOP overall has returned and creat peaked yest and is down slightly today in the mid 4s. Foley cath is out. BUN/Cr continue to slowly improve.   Appears to be recovering. No HD needs at this time, HD cath can be removed today.  Acute resp failure/ vol overload - last HD 1/10 w/ 3 L UF.  Resp issues resolved. Euvolemic on exam now.  Multifocal/ strep pneumoniae w/ bacteremia - IV ampicillin per primary team.   AMS - multifactorial w/ AKI, alcohol abuse . Improving daily. Anemia - of critical illness.  Transfuse prn. Alcohol abuse/ ^LFT's - withdrawal protocol per primary team   Donetta Potts, MD [facility]

## 2022-06-22 NOTE — Progress Notes (Signed)
Id stewardship note   Discussed with dr Pietro Cassis. High burden/persistent strep pna bsi, pneumonia; temp cath placed around time of bacteremia  Tee negative Improved   -I agree with dr Pietro Cassis that temp hd cath should be removed.  -7 day augmentin from removal date of temp cath reasonable course of abx

## 2022-06-22 NOTE — TOC Progression Note (Signed)
Transition of Care [facility]) - Initial/Assessment Note    Patient Details  Name: James [name] MRN: [id number] Date of Birth: Nov 16, 1970  Transition of Care [facility]) CM/SW Contact:    Milinda Antis, [place] Phone Number: 06/22/2022, 1:30 PM  Clinical Narrative:                 CSW received consult for possible SNF placement at time of discharge. CSW spoke with patient at bedside. Patient expressed understanding of PT recommendation and is agreeable to SNF placement at time of discharge. Patient reports preference for a facility in [facility]. CSW discussed insurance authorization process and will provide Medicare SNF ratings list. CSW will send out referrals for review and provide bed offers as available.   Skilled Nursing Rehab Facilities-   [email or web address]   Ratings out of 5 stars (5 the highest)   Name Address  Phone # [place] Inspection Overall  [facility] [address], [place] 4 5 2 3   [facility]  [address] [place], [place] [phone] 4 2 5 5   [facility] [place], [place] 1 3 1 1   [place], [place] 2 2 4 4   [facility] [address] [place] 2 1 2 1   [place] [address], [place] 3 3 4 4   [facility] [address] [place] 4 1 3 2   [facility] [address], [place] 4 1 3 2   [address] ([place]) [place] 3 1 2 1   [facility] [phone], [place] [phone] 3 1 1 1   [facility] [address], [facility] [phone] 3 2 2 2   [facility] ([place]) [place]. [place], [place] [phone] 3 1 1 1   [facility]ust[facility] Flock [address] [place] [phone] 4 2 4 4           [place] [address], [place] 4 1 3 2   [facility] [place] [address] [place] 3 1 5 4   [facility], [place] [address], [place] [phone] 1 1 2 1   [facility] [address], [place] [phone] 2 2 4 4           [address] (no [facility]) [place] [address], [place] [phone] 5 5 5 5   [facility] (No Humana) [address] [phone] [address], [place] 4 1 4 3   [facility] (No UHC) [place], [place] [phone] 5 5 5 5   [facility] [address] [phone] 2 3 5 5   [place] [address] [place] 1 1 2 1   [place] [address], [address] [place] 3 1 1 1   [place], [place] 5 2 5 5   [facility]  [address], [place] 2 2 1 1   [facility] [address], [place] 3 2 1 1   [facility] [place], [place] 2 2 2 2           [facility] [address], [place] 1 1 1 1   [place] [address] [place]  [phone] 2 4 3 3   [facility] [place] [address], [place] [phone] 3 2 3 3   [place], [place] 2 1 1 1   [place] (No Humana) [address], [place] [phone] 2 2 3 3   [facility]) [address], [place] [phone] 2 1 1 1           [facility] [place], [place] 5 4 5 5   [facility])  [address], [place] 2 1 2 1   [facility]) [place] [address], [place] 3 1 4 3   [facility] [place] [address] [place] 3 3 4 4   [address] [place], [place] 2 3 1 1   [facility]) [place], [place] 2 1 4 3     Expected  Discharge Plan: Home/Self Care Barriers to Discharge: Continued Medical Work up   Patient Goals and CMS Choice            Expected Discharge Plan and Services       Living arrangements for the past 2 months: [place]                                      Prior Living Arrangements/Services Living arrangements for the past 2 months: [place] of Daily Living Home Assistive Devices/Equipment: None ADL Screening (condition at time of admission) Patient's cognitive ability adequate to safely complete daily activities?: No Is the patient deaf or have difficulty hearing?: No Does the patient have difficulty seeing, even when wearing glasses/contacts?: No Does the patient have difficulty concentrating, remembering, or making decisions?: Yes Patient able to express need for assistance with ADLs?: Yes Does the patient have difficulty dressing or bathing?: No Independently performs ADLs?: Yes (appropriate for developmental age) Does the patient have difficulty walking or climbing stairs?: No Weakness of Legs: None Weakness of Arms/Hands: None  Permission Sought/Granted                  Emotional Assessment              Admission diagnosis:  Acute respiratory failure with hypoxia ([place]) [J96.01] AKI (acute kidney injury) ([place]) [N17.9] Multifocal pneumonia [J18.9] Sepsis, due to unspecified organism, unspecified whether acute organ dysfunction present [facility]) [A41.9] Patient Active Problem List   Diagnosis Date Noted   Protein-calorie malnutrition, moderate ([place]) 06/17/2022   Sepsis due to Streptococcus pneumoniae ([place]) 06/17/2022   Pressure injury of skin 62/22/9798   Acute metabolic encephalopathy 92/04/9416   Alcohol abuse 06/17/2022   Gouty arthritis of right great toe 06/17/2022   Nicotine dependence, cigarettes, uncomplicated 40/81/4481   Acute kidney injury (AKI) with acute tubular necrosis (ATN)  ([place]) 06/12/2022   Acute respiratory failure with hypoxia ([place]) 06/11/2022   PCP:  Patient, No Pcp Per Pharmacy:   [facility] [place] [place] [address] [place] [address] Phone: [phone] Fax: [place], [place] [address] Phone: [phone] Fax: [phone]     Social Determinants of Health (SDOH) Social History: SDOH Screenings   Food Insecurity: No Food Insecurity (06/15/2022)  Housing: Low Risk  (06/18/2022)  Tobacco Use: High Risk (06/20/2022)   SDOH Interventions:     Readmission  Risk Interventions     No data to display

## 2022-06-22 NOTE — Progress Notes (Signed)
Physical Therapy Treatment Patient Details Name: James [name] MRN: [id number] DOB: [birth year] Today's Date: 06/22/2022   History of Present Illness Pt is a 52yo male presenting to [facility] on 1[facility] with abdominal pain and vomiting. Imaging negative for acute finding. Admitted for evaluation of PNA and AKI. 1/5 CRRT started, stopped 1/8. Episode of SVT. 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  PMH: ETOH abuse, tobacco abuse, pancreatitis.    PT Comments    Modest improvement from prior visit with physical therapy. Has progressed from mod assist to min assist for transfer. Session intended to perform BERG/DGI balance testing as pt requested to go home rather than SNF. He was unable to tolerate any standardized testing today. Pt complains of dizziness, weakness, anxious standing and inability to walk at this time. Agreeable to SNF as he takes care of his mother, and would it is unlikely that he could safely take care of himself in the current state. Patient will continue to benefit from skilled physical therapy services to further improve independence with functional mobility.  Subjective dizziness while mobilizing with PT, sitting back down frequently during transfer training; preventing further progression with ambulation at this time.   06/22/22 0856  Orthostatic Lying   BP- Lying 123/76  Pulse- Lying 85  Orthostatic Sitting  BP- Sitting 142/72  Pulse- Sitting 97  Orthostatic Standing at 0 minutes  BP- Standing at 0 minutes 134/74  Pulse- Standing at 0 minutes 101       Recommendations for follow up therapy are one component of a multi-disciplinary discharge planning process, led by the attending physician.  Recommendations may be updated based on patient status, additional functional criteria and insurance authorization.  Follow Up Recommendations  Skilled nursing-short term rehab (<3 hours/day) Can patient physically be transported by private vehicle: Yes   Assistance Recommended at Discharge  Frequent or constant Supervision/Assistance  Patient can return home with the following Help with stairs or ramp for entrance;Assist for transportation;Assistance with cooking/housework;A lot of help with bathing/dressing/bathroom;A lot of help with walking and/or transfers   Equipment Recommendations  None recommended by PT (TBD)    Recommendations for Other Services OT consult     Precautions / Restrictions Precautions Precautions: Fall Precaution Comments: pt reports hx of falling Restrictions Weight Bearing Restrictions: No Other Position/Activity Restrictions: wbat     Mobility  Bed Mobility Overal bed mobility: Needs Assistance Bed Mobility: Supine to Sit, Sit to Supine     Supine to sit: HOB elevated, Supervision Sit to supine: Modified independent (Device/Increase time)   General bed mobility comments: Supervision for supine to sit, comes close to EOB with slight reduction in awareness and control but was able to perform without phsysical intervention. Returns to bed at mod I level. Performed several times during session due to feeling dizzy.    Transfers Overall transfer level: Needs assistance Equipment used: Rolling walker (2 wheels) Transfers: Sit to/from Stand Sit to Stand: Min assist           General transfer comment: CGA for safety and stability to rise from EOB x 2. Pt with rightward lean, heavy reliance on RW for support upon standing, great difficulty lifting head upright. Pt reports dizziness each time (see orthostatics above.)    Ambulation/Gait               General Gait Details: declined (very anxious, dizzy, requests hold at this time.)   Chief Strategy Officer  Modified Rankin (Stroke Patients Only)       Balance Overall balance assessment: Needs assistance Sitting-balance support: Feet supported, No upper extremity supported Sitting balance-Leahy Scale: Fair   Postural control: Right lateral  lean Standing balance support: Single extremity supported Standing balance-Leahy Scale: Poor Standing balance comment: Progressed to single UE support                            Cognition Arousal/Alertness: Awake/alert Behavior During Therapy: WFL for tasks assessed/performed Overall Cognitive Status: Impaired/Different from baseline Area of Impairment: Following commands, Problem solving, Safety/judgement                       Following Commands: Follows one step commands consistently, Follows one step commands with increased time Safety/Judgement: Decreased awareness of safety, Decreased awareness of deficits   Problem Solving: Slow processing, Requires verbal cues          Exercises      General Comments General comments (skin integrity, edema, etc.): dizzy, weak anxious, coughing, requests cough medication - RN notified.      Pertinent Vitals/Pain Pain Assessment Pain Assessment: No/denies pain    Home Living                          Prior Function            PT Goals (current goals can now be found in the care plan section) Acute Rehab PT Goals Patient Stated Goal: To get stronger PT Goal Formulation: With patient Time For Goal Achievement: 06/30/22 Potential to Achieve Goals: Fair Progress towards PT goals: Progressing toward goals    Frequency    Min 2X/week      PT Plan Current plan remains appropriate    Co-evaluation              AM-PAC PT "6 Clicks" Mobility   Outcome Measure  Help needed turning from your back to your side while in a flat bed without using bedrails?: None Help needed moving from lying on your back to sitting on the side of a flat bed without using bedrails?: A Little Help needed moving to and from a bed to a chair (including a wheelchair)?: A Little Help needed standing up from a chair using your arms (e.g., wheelchair or bedside chair)?: A Little Help needed to walk in hospital room?:  Total Help needed climbing 3-5 steps with a railing? : Total 6 Click Score: 15    End of Session Equipment Utilized During Treatment: Gait belt Activity Tolerance: Patient limited by fatigue Patient left: in bed;with call bell/phone within reach;with bed alarm set Nurse Communication: Mobility status PT Visit Diagnosis: Difficulty in walking, not elsewhere classified (R26.2);Muscle weakness (generalized) (M62.81);History of falling (Z91.81);Unsteadiness on feet (R26.81);Other abnormalities of gait and mobility (R26.89)     Time: 2505-3976 PT Time Calculation (min) (ACUTE ONLY): 26 min  Charges:  $Therapeutic Activity: 23-37 mins                     Candie Mile, PT, DPT Physical Therapist Acute Rehabilitation Services [place] 06/22/2022, 9:17 AM

## 2022-06-22 NOTE — Progress Notes (Signed)
Arrived to patient room. HOB less that 45* Instructed patient on procedure. Sutures removed and pt held breath upon line removal. Pressure held, no s/sx of bleeding. Instructed pt to remain in bed for 30 min. And keeping pressure drsg on for 24 hours keeping it CDI.  And report any s/sx of bleeding. Notified nurse line removed. Pt VU. Fran Lowes, RN VAST

## 2022-06-22 NOTE — [place] FL2 (Signed)
[facility] MEDICAID FL2 LEVEL OF CARE FORM     IDENTIFICATION  Patient Name: James [name] Birthdate: Apr 08, 1971 Sex: male Admission Date (Current Location): 06/10/2022  [facility] and [place] Number:  Herbalist and Address:  [facility], [place] [address], [place], [place] [address]      Provider Number: [id number]  Attending Physician Name and Address:  Terrilee Croak, MD  Relative Name and Phone Number:  Jeral Pinch (Mother) [phone]    Current Level of Care: Hospital Recommended Level of Care: [place] Prior Approval Number:    Date Approved/Denied:   PASRR Number: [id number]  Discharge Plan: SNF    Current Diagnoses: Patient Active Problem List   Diagnosis Date Noted   Protein-calorie malnutrition, moderate ([place]) 06/17/2022   Sepsis due to Streptococcus pneumoniae ([place]) 06/17/2022   Pressure injury of skin 57/06/7791   Acute metabolic encephalopathy 90/30/0923   Alcohol abuse 06/17/2022   Gouty arthritis of right great toe 06/17/2022   Nicotine dependence, cigarettes, uncomplicated 30/12/6224   Acute kidney injury (AKI) with acute tubular necrosis (ATN) ([place]) 06/12/2022   Acute respiratory failure with hypoxia ([place]) 06/11/2022    Orientation RESPIRATION BLADDER Height & Weight     Self, Time, Place  Normal Incontinent Weight: 137 lb 9.1 oz (62.4 kg) Height:  5\' 8"  (172.7 cm)  BEHAVIORAL SYMPTOMS/MOOD NEUROLOGICAL BOWEL NUTRITION STATUS      Incontinent Diet (see d/c summary)  AMBULATORY STATUS COMMUNICATION OF NEEDS Skin   Extensive Assist Verbally PU Stage and Appropriate Care (stage 2 Right buttocks)                       Personal Care Assistance Level of Assistance  Bathing, Feeding, Dressing Bathing Assistance: Limited assistance Feeding assistance: Limited assistance Dressing Assistance: Maximum assistance     Functional Limitations Info  Sight, Hearing, Speech Sight Info:  Adequate Hearing Info: Adequate Speech Info: Adequate    SPECIAL CARE FACTORS FREQUENCY  PT (By licensed PT), OT (By licensed OT)     PT Frequency: 5x/ week OT Frequency: 5x/week            Contractures Contractures Info: Not present    Additional Factors Info  Code Status, Allergies Code Status Info: Full Allergies Info: No Known Allergies           Current Medications (06/22/2022):  This is the current hospital active medication list Current Facility-Administered Medications  Medication Dose Route Frequency Provider Last Rate Last Admin   acetaminophen (TYLENOL) tablet 650 mg  650 mg Oral Q6H PRN Samella Parr, NP   650 mg at 06/18/22 0029   Or   acetaminophen (TYLENOL) suppository 650 mg  650 mg Rectal Q6H PRN Samella Parr, NP       ampicillin (OMNIPEN) 2 g in sodium chloride 0.9 % 100 mL IVPB  2 g Intravenous Q8H Pham, Anh P, RPH 300 mL/hr at 06/22/22 1016 2 g at 06/22/22 1016   Chlorhexidine Gluconate Cloth 2 % PADS 6 each  6 each Topical Q2200 Marshell Garfinkel, MD   6 each at 06/18/22 2146   Chlorhexidine Gluconate Cloth 2 % PADS 6 each  6 each Topical Q0600 Roney Jaffe, MD   6 each at 06/22/22 0612   docusate sodium (COLACE) capsule 100 mg  100 mg Oral BID Mannam, Praveen, MD   100 mg at 06/22/22 1003   feeding supplement (NEPRO CARB STEADY) liquid 237 mL  237 mL  Oral BID BM Dahal, Marlowe Aschoff, MD   237 mL at 01/60/10 9323   folic acid injection 1 mg  1 mg Intravenous Daily Mannam, Praveen, MD   1 mg at 06/22/22 1010   guaiFENesin-dextromethorphan (ROBITUSSIN DM) 100-10 MG/5ML syrup 5 mL  5 mL Oral Q4H PRN Dahal, Marlowe Aschoff, MD   5 mL at 06/22/22 1002   heparin injection 5,000 Units  5,000 Units Subcutaneous Q8H Samella Parr, NP   5,000 Units at 06/22/22 0612   hydrALAZINE (APRESOLINE) injection 10 mg  10 mg Intravenous Q6H PRN Vernelle Emerald, MD   10 mg at 06/17/22 0905   lactulose (CHRONULAC) 10 GM/15ML solution 20 g  20 g Oral Daily Mannam, Praveen, MD   20  g at 06/22/22 1002   LORazepam (ATIVAN) injection 1-2 mg  1-2 mg Intravenous Q1H PRN Judd Lien, MD   2 mg at 06/17/22 0135   multivitamin with minerals tablet 1 tablet  1 tablet Oral Daily Samella Parr, NP   1 tablet at 06/22/22 1003   nicotine (NICODERM CQ - dosed in mg/24 hours) patch 21 mg  21 mg Transdermal Daily Ollis, Brandi L, NP   21 mg at 06/22/22 1003   ondansetron (ZOFRAN) tablet 4 mg  4 mg Oral Q6H PRN Samella Parr, NP   4 mg at 06/12/22 2006   Or   ondansetron (ZOFRAN) injection 4 mg  4 mg Intravenous Q6H PRN Samella Parr, NP       Oral care mouth rinse  15 mL Mouth Rinse 4 times per day Barb Merino, MD   15 mL at 06/22/22 1144   Oral care mouth rinse  15 mL Mouth Rinse PRN Barb Merino, MD   15 mL at 06/17/22 0135   pantoprazole (PROTONIX) EC tablet 40 mg  40 mg Oral Daily Shalhoub, Sherryll Burger, MD   40 mg at 06/22/22 1004   polyethylene glycol (MIRALAX / GLYCOLAX) packet 17 g  17 g Oral Daily Mannam, Praveen, MD   17 g at 06/20/22 0935   [START ON 06/23/2022] predniSONE (DELTASONE) tablet 10 mg  10 mg Oral Q breakfast Dahal, Binaya, MD       sodium chloride flush (NS) 0.9 % injection 10-40 mL  10-40 mL Intracatheter Q12H Dahal, Binaya, MD   10 mL at 06/22/22 1025   sodium chloride flush (NS) 0.9 % injection 10-40 mL  10-40 mL Intracatheter PRN Dahal, Marlowe Aschoff, MD       thiamine (VITAMIN B1) 500 mg in normal saline (50 mL) IVPB  500 mg Intravenous Q24H Samella Parr, NP 100 mL/hr at 06/22/22 1024 500 mg at 06/22/22 1024     Discharge Medications: Please see discharge summary for a list of discharge medications.  Relevant Imaging Results:  Relevant Lab Results:   Additional Information SSN:  [id number] F Kanyon Bunn, LCSWA

## 2022-06-22 NOTE — Progress Notes (Incomplete)
Nutrition Follow-up  DOCUMENTATION CODES:   Non-severe (moderate) malnutrition in context of chronic illness  INTERVENTION:  -Liberalize diet to Regular, think liquids.   - Discontinue Nepro shakes.   NUTRITION DIAGNOSIS:   Moderate Malnutrition related to chronic illness (alcohol abuse and pancreatitis) as evidenced by moderate fat depletion, moderate muscle depletion. - Ongoing.   GOAL:   Patient will meet greater than or equal to 90% of their needs - Progressing, diet advanced.   MONITOR:   PO intake, Supplement acceptance, Diet advancement, Weight trends, Labs  REASON FOR ASSESSMENT:   Malnutrition Screening Tool    ASSESSMENT:   52 year old male with PMH alcohol abuse and pancreatitis who presented on 1/3 with reports of upper abdominal pain, nausea and vomiting. Found to have hepatic steatosis, sepsis, PNA, and AKI requiring CRRT (1/5-1/8).  Meds reviewed: colace, folic acid, MVI, miralax, prednisone. Labs reviewed: K low.   The pt reports that his appetite is improving since he has been here. He states that he has had no appetite PTA. He reports that he was drinking the Nepro shakes but they were giving him diarrhea. RD will discontinue Nepro BID. RD will liberalize diet to allow pt to have more options. Will continue to monitor PO intakes.   Diet Order:   Diet Order             Diet regular Room service appropriate? Yes; Fluid consistency: Thin  Diet effective now                   EDUCATION NEEDS:   Education needs have been addressed  Skin:  Skin Assessment: Skin Integrity Issues: Skin Integrity Issues:: Stage II Stage II: R upper buttocks  Last BM:  06/21/22  Height:   Ht Readings from Last 1 Encounters:  06/11/22 5\' 8"  (1.727 m)    Weight:   Wt Readings from Last 1 Encounters:  06/23/22 58.2 kg    Ideal Body Weight:  70 kg  BMI:  Body mass index is 19.51 kg/m.  Estimated Nutritional Needs:   Kcal:  1800-1950 kcals  Protein:   80-90 grams  Fluid:  >/= 1.8L  Takiah Maiden Graciela Husbands, RD, LDN, CNSC.

## 2022-06-22 NOTE — Progress Notes (Signed)
PROGRESS NOTE  James [name]  DOB: [birth year]  PCP: Patient, No Pcp Per UEA:[id number]  DOA: 06/10/2022  LOS: 11 days  Hospital Day: 13  Brief narrative: James [name] is a 52 y.o. male with PMH significant for chronic alcoholism, chronic smoking, pancreatitis. 1/3, patient presented to [facility] ED with complaint of abdominal pain, shortness of breath In the ED, patient was found to be extremely short of breath, tachypneic, requiring high flow oxygen.   Workup revealed creatinine significantly elevated to 7.74 Chest x-ray showed right middle lobe and bibasilar pneumonia Patient was started on IV antibiotics, IV fluid for sepsis Admitted to [facility] Nephrology consultation was obtained. Workup included ANA/ANCA/GBM/complement levels all of which were unremarkable.   1/5, patient was started on CRRT 1/8, right IJ hemodialysis catheter was placed 1/10, transferred to [facility] for maintenance hemodialysis  Of note, blood cultures sent on admission showed Streptococcus pneumonia bacteremia in 1 out of 4 bottles TTE 1/8 was concerning for endocarditis on the anterior leaflet of mitral valve.  Cardiology was consulted for consideration of TEE See below for details  Subjective: Patient was seen and examined this morning. Lying on bed.  Not in distress.  No new symptoms. Discussed with ID and nephrology. Remove right IJ HD cath today.  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   Blood cultures sent on admission showed Streptococcus pneumonia bacteremia in 1 out of 4 bottles TTE 1/8 was concerning for endocarditis on the anterior leaflet of mitral valve.  Cardiology was consulted for consideration of TEE Repeat blood cultures sent on 1/8 does not show any growth so far. No recurrence of fever.  WBC count improved Currently  on IV antibiotics.  Discussed with infectious disease.  Recommended tomorrow right IJ catheter.  Discussed with nephrologist.  Patient does not need hemodialysis any longer.  Ordered to remove right IJ catheter.  Per ID recommendation, patient to get oral Augmentin for 7 days after removal of catheter. Recent Labs  Lab 06/16/22 0437 06/17/22 0426 06/20/22 0311 06/21/22 0410  WBC 10.8* 12.4* 6.2 7.0   Acute kidney injury (AKI) Presented with creatinine significantly elevated to 7.74  1/5, patient was started on CRRT 1/8, right IJ hemodialysis catheter was placed 1/10, transferred to [facility] for maintenance hemodialysis Workup thus far negative including negative ANA's, ANCA, GBM, complement levels Nephrology following.  Patient continues to make urine output. Probably will not need long-term dialysis. Recent Labs    06/15/22 0244 06/15/22 0247 06/15/22 1543 06/16/22 0437 06/17/22 0426 06/18/22 0334 06/19/22 0846 06/20/22 0311 06/21/22 0410 06/22/22 0516  BUN 21* 24* 22* 35* 55* 37* 61* 75* 73* 66*  CREATININE 1.77* 1.89* 2.06* 3.26* 4.42* 3.42* 4.61* 4.84* 4.71* 4.13*   Acute anemia. Currently hemoglobin is stable between 8 and 9.  Continue to monitor Recent Labs    06/11/22 1027 06/12/22 0304 06/15/22 0244 06/16/22 0437 06/17/22 0426 06/20/22 0311 06/21/22 0410  HGB  --    < > 11.1* 10.3* 10.4* 8.5* 8.3*  MCV  --    < > 97.6 95.5 96.7 93.9 95.4  VITAMINB12 287  --   --   --   --   --   --    < > = values in this interval not displayed.    Acute metabolic encephalopathy multifactorial secondary to infection, uremia TSH, vitamin B12, ammonia, drug screen, VBG unremarkable on 1/4  CT head 1/3 unremarkable  Mental status gradually improved.   Alcohol abuse Patient was managed with CIWA protocol for the first several days of hospitalization and has since been discontinued. 1/13, ultrasound right upper quadrant did not show any evidence of cirrhosis or ascites.   Abdominal distention improved.   Protein-calorie malnutrition, moderate  Nutrition following, their input is appreciated Boost nutritional supplements Assisting patient with all meals Daily multivitamin   Gouty arthritis of right great toe Identified 1/8 and confirmed via podiatry consultation at that time with Dr. Blenda Mounts Patient was started on prednisone tapering course.  Continue to taper.   Nicotine dependence, cigarettes, uncomplicated Providing patient with daily nicotine patches   Abdominal ileus Improved with IV Reglan PRN.  Currently tolerating regular diet.   Mobility: Encourage ambulation.  PT evaluation obtained.  SNF recommended  Goals of care   Code Status: Full Code    Scheduled Meds:  Chlorhexidine Gluconate Cloth  6 each Topical Q2200   Chlorhexidine Gluconate Cloth  6 each Topical Q0600   docusate sodium  100 mg Oral BID   feeding supplement (NEPRO CARB STEADY)  237 mL Oral BID BM   folic acid  1 mg Intravenous Daily   heparin  5,000 Units Subcutaneous Q8H   lactulose  20 g Oral Daily   multivitamin with minerals  1 tablet Oral Daily   nicotine  21 mg Transdermal Daily   mouth rinse  15 mL Mouth Rinse 4 times per day   pantoprazole  40 mg Oral Daily   polyethylene glycol  17 g Oral Daily   [START ON 06/23/2022] predniSONE  10 mg Oral Q breakfast   sodium chloride flush  10-40 mL Intracatheter Q12H    PRN meds: acetaminophen **OR** acetaminophen, guaiFENesin-dextromethorphan, hydrALAZINE, LORazepam, ondansetron **OR** ondansetron (ZOFRAN) IV, mouth rinse, sodium chloride flush   Infusions:   ampicillin (OMNIPEN) IV 2 g (06/22/22 1016)   thiamine (VITAMIN B1) injection 500 mg (06/22/22 1024)    Skin assessment:  Pressure Injury 06/14/22 Buttocks Right;Upper Stage 2 -  Partial thickness loss of dermis presenting as a shallow open injury with a red, pink wound bed without slough. Pea size (Active)  06/14/22 0500  Location: Buttocks  Location  Orientation: Right;Upper  Staging: Stage 2 -  Partial thickness loss of dermis presenting as a shallow open injury with a red, pink wound bed without slough.  Wound Description (Comments): Pea size  Present on Admission:     Nutritional status:  Body mass index is 20.92 kg/m.  Nutrition Problem: Moderate Malnutrition Etiology: chronic illness (alcohol abuse and pancreatitis) Signs/Symptoms: moderate fat depletion, moderate muscle depletion     Diet:  Diet Order             Diet renal with fluid restriction Fluid restriction: 1200 mL Fluid; Room service appropriate? Yes; Fluid consistency: Thin  Diet effective now                   DVT prophylaxis:  heparin injection 5,000 Units Start: 06/11/22 0800   Antimicrobials: IV ampicillin.  Switch to oral Augmentin from tomorrow Fluid: None Consultants: Nephro, cardiology, ID Family Communication: None at bedside  Status is: Inpatient  Continue in-hospital care because: On IV antibiotics Level of care: Telemetry Medical   Dispo: The patient is from: Home              Anticipated d/c is to: Pending clinical course  Patient currently is not medically stable to d/c.   Difficult to place patient No    Antimicrobials: Anti-infectives (From admission, onward)    Start     Dose/Rate Route Frequency Ordered Stop   06/16/22 1000  ampicillin (OMNIPEN) 2 g in sodium chloride 0.9 % 100 mL IVPB        2 g 300 mL/hr over 20 Minutes Intravenous Every 8 hours 06/16/22 0808     06/15/22 1500  ampicillin (OMNIPEN) 2 g in sodium chloride 0.9 % 100 mL IVPB  Status:  Discontinued        2 g 300 mL/hr over 20 Minutes Intravenous Every 6 hours 06/15/22 1404 06/16/22 0808   06/13/22 2200  ampicillin (OMNIPEN) 2 g in sodium chloride 0.9 % 100 mL IVPB  Status:  Discontinued        2 g 300 mL/hr over 20 Minutes Intravenous Every 8 hours 06/13/22 1208 06/15/22 1404   06/12/22 0800  cefTRIAXone (ROCEPHIN) 2 g in sodium chloride  0.9 % 100 mL IVPB  Status:  Discontinued        2 g 200 mL/hr over 30 Minutes Intravenous Every 24 hours 06/11/22 1650 06/13/22 1208   06/11/22 2200  linezolid (ZYVOX) IVPB 600 mg  Status:  Discontinued        600 mg 300 mL/hr over 60 Minutes Intravenous Every 12 hours 06/11/22 1100 06/11/22 1307   06/11/22 1200  ceFEPIme (MAXIPIME) 1 g in sodium chloride 0.9 % 100 mL IVPB  Status:  Discontinued        1 g 200 mL/hr over 30 Minutes Intravenous Every 24 hours 06/11/22 1034 06/11/22 1648   06/11/22 1200  linezolid (ZYVOX) IVPB 600 mg  Status:  Discontinued        600 mg 300 mL/hr over 60 Minutes Intravenous  Once 06/11/22 1047 06/11/22 1351   06/11/22 1100  metroNIDAZOLE (FLAGYL) IVPB 500 mg  Status:  Discontinued        500 mg 100 mL/hr over 60 Minutes Intravenous Every 12 hours 06/11/22 1006 06/13/22 1208   06/10/22 2330  cefTRIAXone (ROCEPHIN) 2 g in sodium chloride 0.9 % 100 mL IVPB  Status:  Discontinued        2 g 200 mL/hr over 30 Minutes Intravenous Every 24 hours 06/10/22 2329 06/11/22 1004   06/10/22 2330  azithromycin (ZITHROMAX) 500 mg in sodium chloride 0.9 % 250 mL IVPB  Status:  Discontinued        500 mg 250 mL/hr over 60 Minutes Intravenous Every 24 hours 06/10/22 2329 06/11/22 1004       Objective: Vitals:   06/22/22 0403 06/22/22 0832  BP: 136/88 130/80  Pulse: 81 88  Resp: 18 20  Temp: 98.1 F (36.7 C) 98 F (36.7 C)  SpO2: 99% 96%    Intake/Output Summary (Last 24 hours) at 06/22/2022 1253 Last data filed at 06/22/2022 1025 Gross per 24 hour  Intake 1348 ml  Output 1370 ml  Net -22 ml   Filed Weights   06/19/22 2026 06/20/22 2108 06/22/22 0500  Weight: 62 kg 62 kg 62.4 kg   Weight change: 0.4 kg Body mass index is 20.92 kg/m.   Physical Exam: General exam: Pleasant, middle-aged African-American male.  Not in pain. Skin: No rashes, lesions or ulcers. HEENT: Atraumatic, normocephalic, no obvious bleeding Lungs: Clear to auscultation  bilaterally CVS: Regular rate and rhythm, no murmur GI/Abd soft, distended and tenderness improved, bowel sound present CNS: Alert, awake, oriented x 3  Psychiatry: Mood appropriate Extremities: No pedal edema, no calf tenderness  Data Review: I have personally reviewed the laboratory data and studies available.  F/u labs ordered Unresulted Labs (From admission, onward)     Start     Ordered   06/22/22 7371  Basic metabolic panel  Daily,   R     Question:  Specimen collection method  Answer:  IV Team=IV Team collect   06/21/22 1208            Total time spent in review of labs and imaging, patient evaluation, formulation of plan, documentation and communication with family 34 minutes  Signed, Terrilee Croak, MD [facility] 06/22/2022

## 2022-06-23 DIAGNOSIS — J9601 Acute respiratory failure with hypoxia: Secondary | ICD-10-CM | POA: Diagnosis not present

## 2022-06-23 LAB — GLUCOSE, CAPILLARY
Glucose-Capillary: 142 mg/dL — ABNORMAL HIGH (ref 70–99)
Glucose-Capillary: 106 mg/dL — ABNORMAL HIGH (ref 70–99)
Glucose-Capillary: 108 mg/dL — ABNORMAL HIGH (ref 70–99)
Glucose-Capillary: 117 mg/dL — ABNORMAL HIGH (ref 70–99)

## 2022-06-23 LAB — BASIC METABOLIC PANEL
Anion gap: 11 (ref 5–15)
BUN: 53 mg/dL — ABNORMAL HIGH (ref 6–20)
CO2: 25 mmol/L (ref 22–32)
Calcium: 8.5 mg/dL — ABNORMAL LOW (ref 8.9–10.3)
Chloride: 104 mmol/L (ref 98–111)
Creatinine, Ser: 3.15 mg/dL — ABNORMAL HIGH (ref 0.61–1.24)
GFR, Estimated: 23 mL/min — ABNORMAL LOW (ref >60)
Glucose, Bld: 118 mg/dL — ABNORMAL HIGH (ref 70–99)
Potassium: 3.4 mmol/L — ABNORMAL LOW (ref 3.5–5.1)
Sodium: 140 mmol/L (ref 135–145)

## 2022-06-23 MED ORDER — AMOXICILLIN-POT CLAVULANATE 500-125 MG PO TABS
1.0000 | ORAL_TABLET | Freq: Two times a day (BID) | ORAL | Status: DC
  Administered 2022-06-23 – 2022-06-25 (×5): 1 via ORAL
  Filled 2022-06-23 (×6): qty 1

## 2022-06-23 MED ORDER — POTASSIUM CHLORIDE CRYS ER 20 MEQ PO TBCR
40.0000 meq | EXTENDED_RELEASE_TABLET | Freq: Once | ORAL | Status: AC
  Administered 2022-06-23: 40 meq via ORAL
  Filled 2022-06-23 (×2): qty 2

## 2022-06-23 MED ORDER — POTASSIUM CHLORIDE CRYS ER 20 MEQ PO TBCR: 40.0000 meq | EXTENDED_RELEASE_TABLET | Freq: Once | ORAL | Status: DC

## 2022-06-23 NOTE — Discharge Summary (Signed)
PROGRESS NOTE  James [name]  PCP: Patient, No Pcp Per JJO:[id number]  DOA: 06/10/2022  LOS: 12 days  Hospital Day: 14  Brief narrative: James [name] is a 52 y.o. male with PMH significant for chronic alcoholism, chronic smoking, pancreatitis. 1/3, patient presented to [facility] ED with complaint of abdominal pain, shortness of breath In the ED, patient was found to be extremely short of breath, tachypneic, requiring high flow oxygen.   Workup revealed creatinine significantly elevated to 7.74 Chest x-ray showed right middle lobe and bibasilar pneumonia Patient was started on IV antibiotics, IV fluid for sepsis Admitted to [facility] Nephrology consultation was obtained. Workup included ANA/ANCA/GBM/complement levels all of which were unremarkable.   1/5, patient was started on CRRT 1/8, right IJ hemodialysis catheter was placed 1/10, transferred to [facility] for maintenance hemodialysis  Of note, blood cultures sent on admission showed Streptococcus pneumonia bacteremia in 1 out of 4 bottles TTE 1/8 was concerning for endocarditis on the anterior leaflet of mitral valve.  Cardiology was consulted for consideration of TEE See below for details  Subjective: Patient was seen and examined this morning. Lying on bed.  Not in distress.  No new symptoms.  Right IJ catheter taken out yesterday. 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   Blood cultures sent on admission showed Streptococcus pneumonia bacteremia in 1 out of 4 bottles TTE 1/8 was concerning for endocarditis on the anterior leaflet of mitral valve.  Cardiology was consulted for consideration of TEE Repeat blood cultures sent on 1/8 does not show any growth so far. No recurrence of fever.  WBC count improved Initially  treated with IV antibiotics.  Per ID recommendation, antibiotics switched to oral Augmentin yesterday for 7 days since removal of IJ catheter on 1/15 (EOT 1/22) Recent Labs  Lab 06/17/22 0426 06/20/22 0311 06/21/22 0410  WBC 12.4* 6.2 7.0   Acute kidney injury (AKI) Presented with creatinine significantly elevated to 7.74  Nephrology consult appreciated. 1/5, patient was started on CRRT 1/8, right IJ hemodialysis catheter was placed 1/10, transferred to [facility] for maintenance hemodialysis Workup thus far negative including negative ANA's, ANCA, GBM, complement levels Fortunately, he started to make urine again.  Creatinine improving daily without ongoing hydration. Recent Labs    06/15/22 0247 06/15/22 1543 06/16/22 0437 06/17/22 0426 06/18/22 0334 06/19/22 0846 06/20/22 0311 06/21/22 0410 06/22/22 0516 06/23/22 0426  BUN 24* 22* 35* 55* 37* 61* 75* 73* 66* 53*  CREATININE 1.89* 2.06* 3.26* 4.42* 3.42* 4.61* 4.84* 4.71* 4.13* 3.15*   Acute anemia. Currently hemoglobin is stable between 8 and 9.  Continue to monitor Recent Labs    06/11/22 1027 06/12/22 0304 06/15/22 0244 06/16/22 0437 06/17/22 0426 06/20/22 0311 06/21/22 0410  HGB  --    < > 11.1* 10.3* 10.4* 8.5* 8.3*  MCV  --    < > 97.6 95.5 96.7 93.9 95.4  VITAMINB12 287  --   --   --   --   --   --    < > = values in this interval not displayed.   Acute metabolic encephalopathy multifactorial secondary to infection, uremia TSH, vitamin B12, ammonia, drug screen, VBG unremarkable on 1/4 CT head 1/3 unremarkable  Mental status gradually improved.   Alcohol abuse Patient was managed with CIWA protocol for the first several days of hospitalization  and has since been discontinued. 1/13, ultrasound right upper quadrant did not show any evidence of cirrhosis or ascites.  Abdominal distention improved.   Protein-calorie malnutrition, moderate  Nutrition following, their input is appreciated Boost  nutritional supplements Assisting patient with all meals Daily multivitamin   Gouty arthritis of right great toe Identified 1/8 and confirmed via podiatry consultation at that time with Dr. Blenda Mounts Patient was started on prednisone tapering course.  Continue to taper.   Nicotine dependence, cigarettes, uncomplicated Providing patient with daily nicotine patches   Abdominal ileus Improved with IV Reglan PRN.  Currently tolerating regular diet.   Mobility: Encourage ambulation.  PT evaluation obtained.  SNF recommended  Goals of care   Code Status: Full Code    Scheduled Meds:  amoxicillin-clavulanate  1 tablet Oral BID   Chlorhexidine Gluconate Cloth  6 each Topical Q2200   Chlorhexidine Gluconate Cloth  6 each Topical Q0600   docusate sodium  100 mg Oral BID   feeding supplement (NEPRO CARB STEADY)  237 mL Oral BID BM   folic acid  1 mg Intravenous Daily   heparin  5,000 Units Subcutaneous Q8H   lactulose  20 g Oral Daily   multivitamin with minerals  1 tablet Oral Daily   nicotine  21 mg Transdermal Daily   mouth rinse  15 mL Mouth Rinse 4 times per day   pantoprazole  40 mg Oral Daily   polyethylene glycol  17 g Oral Daily   predniSONE  10 mg Oral Q breakfast   sodium chloride flush  10-40 mL Intracatheter Q12H    PRN meds: acetaminophen **OR** acetaminophen, guaiFENesin-dextromethorphan, hydrALAZINE, LORazepam, ondansetron **OR** ondansetron (ZOFRAN) IV, mouth rinse, sodium chloride flush   Infusions:   thiamine (VITAMIN B1) injection 500 mg (06/23/22 1115)    Skin assessment:  Pressure Injury 06/14/22 Buttocks Right;Upper Stage 2 -  Partial thickness loss of dermis presenting as a shallow open injury with a red, pink wound bed without slough. Pea size (Active)  06/14/22 0500  Location: Buttocks  Location Orientation: Right;Upper  Staging: Stage 2 -  Partial thickness loss of dermis presenting as a shallow open injury with a red, pink wound bed without slough.   Wound Description (Comments): Pea size  Present on Admission:     Nutritional status:  Body mass index is 19.51 kg/m.  Nutrition Problem: Moderate Malnutrition Etiology: chronic illness (alcohol abuse and pancreatitis) Signs/Symptoms: moderate fat depletion, moderate muscle depletion     Diet:  Diet Order             Diet renal with fluid restriction Fluid restriction: 1200 mL Fluid; Room service appropriate? Yes; Fluid consistency: Thin  Diet effective now                   DVT prophylaxis:  heparin injection 5,000 Units Start: 06/11/22 0800   Antimicrobials: Oral Augmentin Fluid: None Consultants: Nephro, cardiology, ID Family Communication: None at bedside  Status is: Inpatient  Continue in-hospital care because: Pending SNF Level of care: Telemetry Medical   Dispo: The patient is from: Home              Anticipated d/c is to: Pending SNF              Patient currently is medically stable to d/c.   Difficult to place patient No    Antimicrobials: Anti-infectives (From admission, onward)    Start     Dose/Rate Route Frequency Ordered Stop   06/23/22  1000  amoxicillin-clavulanate (AUGMENTIN) 500-125 MG per tablet 1 tablet        1 tablet Oral 2 times daily 06/23/22 0815 06/30/22 0959   06/16/22 1000  ampicillin (OMNIPEN) 2 g in sodium chloride 0.9 % 100 mL IVPB  Status:  Discontinued        2 g 300 mL/hr over 20 Minutes Intravenous Every 8 hours 06/16/22 0808 06/23/22 0815   06/15/22 1500  ampicillin (OMNIPEN) 2 g in sodium chloride 0.9 % 100 mL IVPB  Status:  Discontinued        2 g 300 mL/hr over 20 Minutes Intravenous Every 6 hours 06/15/22 1404 06/16/22 0808   06/13/22 2200  ampicillin (OMNIPEN) 2 g in sodium chloride 0.9 % 100 mL IVPB  Status:  Discontinued        2 g 300 mL/hr over 20 Minutes Intravenous Every 8 hours 06/13/22 1208 06/15/22 1404   06/12/22 0800  cefTRIAXone (ROCEPHIN) 2 g in sodium chloride 0.9 % 100 mL IVPB  Status:   Discontinued        2 g 200 mL/hr over 30 Minutes Intravenous Every 24 hours 06/11/22 1650 06/13/22 1208   06/11/22 2200  linezolid (ZYVOX) IVPB 600 mg  Status:  Discontinued        600 mg 300 mL/hr over 60 Minutes Intravenous Every 12 hours 06/11/22 1100 06/11/22 1307   06/11/22 1200  ceFEPIme (MAXIPIME) 1 g in sodium chloride 0.9 % 100 mL IVPB  Status:  Discontinued        1 g 200 mL/hr over 30 Minutes Intravenous Every 24 hours 06/11/22 1034 06/11/22 1648   06/11/22 1200  linezolid (ZYVOX) IVPB 600 mg  Status:  Discontinued        600 mg 300 mL/hr over 60 Minutes Intravenous  Once 06/11/22 1047 06/11/22 1351   06/11/22 1100  metroNIDAZOLE (FLAGYL) IVPB 500 mg  Status:  Discontinued        500 mg 100 mL/hr over 60 Minutes Intravenous Every 12 hours 06/11/22 1006 06/13/22 1208   06/10/22 2330  cefTRIAXone (ROCEPHIN) 2 g in sodium chloride 0.9 % 100 mL IVPB  Status:  Discontinued        2 g 200 mL/hr over 30 Minutes Intravenous Every 24 hours 06/10/22 2329 06/11/22 1004   06/10/22 2330  azithromycin (ZITHROMAX) 500 mg in sodium chloride 0.9 % 250 mL IVPB  Status:  Discontinued        500 mg 250 mL/hr over 60 Minutes Intravenous Every 24 hours 06/10/22 2329 06/11/22 1004       Objective: Vitals:   06/23/22 0534 06/23/22 0900  BP: 118/82 121/78  Pulse: 81 78  Resp: 18 18  Temp: 98.2 F (36.8 C) 98.4 F (36.9 C)  SpO2: 99% 98%    Intake/Output Summary (Last 24 hours) at 06/23/2022 1403 Last data filed at 06/23/2022 1311 Gross per 24 hour  Intake 690 ml  Output 4200 ml  Net -3510 ml   Filed Weights   06/20/22 2108 06/22/22 0500 06/23/22 0500  Weight: 62 kg 62.4 kg 58.2 kg   Weight change: -4.2 kg Body mass index is 19.51 kg/m.   Physical Exam: General exam: Pleasant, middle-aged African-American male.  Not in pain. Skin: No rashes, lesions or ulcers. HEENT: Atraumatic, normocephalic, no obvious bleeding Lungs: Clear to auscultation bilaterally CVS: Regular rate and  rhythm, no murmur GI/Abd soft, distended and tenderness improved, bowel sound present CNS: Alert, awake, oriented x 3 Psychiatry: Mood appropriate Extremities: No  pedal edema, no calf tenderness  Data Review: I have personally reviewed the laboratory data and studies available.  F/u labs ordered Unresulted Labs (From admission, onward)     Start     Ordered   06/22/22 4128  Basic metabolic panel  Daily,   R     Question:  Specimen collection method  Answer:  IV Team=IV Team collect   06/21/22 1208            Total time spent in review of labs and imaging, patient evaluation, formulation of plan, documentation and communication with family 25 minutes  Signed, Terrilee Croak, MD [facility] 06/23/2022

## 2022-06-23 NOTE — TOC Progression Note (Addendum)
Transition of Care [facility]) - Initial/Assessment Note    Patient Details  Name: James [name] MRN: [id number] Date of Birth: 1971-05-05  Transition of Care [facility]) CM/SW Contact:    James [name], James [name] Phone Number: 06/23/2022, 11:23 AM  Clinical Narrative:                 LCSW  met with the patient at bedside.  The patient choose [name] (1st) or [name] (2nd).  Patient agreed for LCSW to contact his daughter to relay information.  LCSW contacted the daughter.  There was no answer.  A voicemail was left requesting a returned call.    LCSW spoke with James [name] with admissions.  The facility will send a representative to complete a bedside assessment to determine if one of the facilities can accept the patient.   TOC following.    Expected Discharge Plan: Home/Self Care Barriers to Discharge: Continued Medical Work up   Patient Goals and CMS Choice            Expected Discharge Plan and Services       Living arrangements for the past 2 months: Single Family Home                                      Prior Living Arrangements/Services Living arrangements for the past 2 months: [place] of Daily Living Home Assistive Devices/Equipment: None ADL Screening (condition at time of admission) Patient's cognitive ability adequate to safely complete daily activities?: No Is the patient deaf or have difficulty hearing?: No Does the patient have difficulty seeing, even when wearing glasses/contacts?: No Does the patient have difficulty concentrating, remembering, or making decisions?: Yes Patient able to express need for assistance with ADLs?: Yes Does the patient have difficulty dressing or bathing?: No Independently performs ADLs?: Yes (appropriate for developmental age) Does the patient have difficulty walking or climbing stairs?: No Weakness of Legs: None Weakness of Arms/Hands: None  Permission  Sought/Granted                  Emotional Assessment              Admission diagnosis:  Acute respiratory failure with hypoxia ([place]) [J96.01] AKI (acute kidney injury) ([place]) [N17.9] Multifocal pneumonia [J18.9] Sepsis, due to unspecified organism, unspecified whether acute organ dysfunction present [facility]) [A41.9] Patient Active Problem List   Diagnosis Date Noted   Protein-calorie malnutrition, moderate ([place]) 06/17/2022   Sepsis due to Streptococcus pneumoniae ([place]) 06/17/2022   Pressure injury of skin 15/40/0867   Acute metabolic encephalopathy 61/95/0932   Alcohol abuse 06/17/2022   Gouty arthritis of right great toe 06/17/2022   Nicotine dependence, cigarettes, uncomplicated 67/05/4579   Acute kidney injury (AKI) with acute tubular necrosis (ATN) ([place]) 06/12/2022   Acute respiratory failure with hypoxia ([place]) 06/11/2022   PCP:  Patient, No Pcp Per Pharmacy:   [facility], [place] [address] [place] [address] Phone: [phone] Fax: [place], [place] [address] Phone: [phone] Fax: [phone]     Social Determinants of Health (SDOH) Social History: SDOH Screenings   Food Insecurity: No Food Insecurity (06/15/2022)  Housing: Low Risk  (06/18/2022)  Tobacco Use: High Risk (06/20/2022)   SDOH Interventions:     Readmission Risk Interventions     No data to display

## 2022-06-23 NOTE — Progress Notes (Signed)
Patient ID: James [name], male   DOB: [birth year], 52 y.o.   MRN: [id number] S: Feeling much better. O:BP 121/78 (BP Location: Right Arm)   Pulse 78   Temp 98.4 F (36.9 C) (Oral)   Resp 18   Ht 5\' 8"  (1.727 m)   Wt 58.2 kg   SpO2 98%   BMI 19.51 kg/m   Intake/Output Summary (Last 24 hours) at 06/23/2022 1049 Last data filed at 06/23/2022 0900 Gross per 24 hour  Intake 880 ml  Output 4400 ml  Net -3520 ml   Intake/Output: I/O last 3 completed shifts: In: 1748 [P.O.:938; I.V.:10; IV Piggyback:800] Out: 1520 [Urine:1520]  Intake/Output this shift:  Total I/O In: 240 [P.O.:240] Out: 4000 [Urine:4000] Weight change: -4.2 kg Gen: NAD CVS: RRR Resp: CTA Abd: +BS, soft, NT/ND Ext: no edema  Recent Labs  Lab 06/17/22 0426 06/18/22 0334 06/19/22 0846 06/20/22 0311 06/21/22 0410 06/22/22 0516 06/23/22 0426  NA 140 139 136 136 139 138 140  K 4.2 3.6 3.6 3.3* 3.5 3.4* 3.4*  CL 111 101 98 100 103 104 104  CO2 18* 26 26 24 24 25 25   GLUCOSE 123* 99 128* 92 104* 126* 118*  BUN 55* 37* 61* 75* 73* 66* 53*  CREATININE 4.42* 3.42* 4.61* 4.84* 4.71* 4.13* 3.15*  ALBUMIN 1.8* 1.8* 1.7* 1.8*  --   --   --   CALCIUM 8.3* 8.1* 8.1* 7.9* 8.1* 8.1* 8.5*  PHOS 6.9* 6.1* 5.3* 6.0*  --   --   --    Liver Function Tests: Recent Labs  Lab 06/18/22 0334 06/19/22 0846 06/20/22 0311  ALBUMIN 1.8* 1.7* 1.8*   No results for input(s): "LIPASE", "AMYLASE" in the last 168 hours. No results for input(s): "AMMONIA" in the last 168 hours. CBC: Recent Labs  Lab 06/17/22 0426 06/20/22 0311 06/21/22 0410  WBC 12.4* 6.2 7.0  NEUTROABS  --  4.7 5.6  HGB 10.4* 8.5* 8.3*  HCT 29.7* 23.2* 22.9*  MCV 96.7 93.9 95.4  PLT 241 274 315   Cardiac Enzymes: No results for input(s): "CKTOTAL", "CKMB", "CKMBINDEX", "TROPONINI" in the last 168 hours. CBG: Recent Labs  Lab 06/22/22 0836 06/22/22 1105 06/22/22 1631 06/22/22 2012 06/23/22 0714  GLUCAP 126* 180* 138* 96 142*    Iron  Studies: No results for input(s): "IRON", "TIBC", "TRANSFERRIN", "FERRITIN" in the last 72 hours. Studies/Results: No results found.  amoxicillin-clavulanate  1 tablet Oral BID   Chlorhexidine Gluconate Cloth  6 each Topical Q2200   Chlorhexidine Gluconate Cloth  6 each Topical Q0600   docusate sodium  100 mg Oral BID   feeding supplement (NEPRO CARB STEADY)  237 mL Oral BID BM   folic acid  1 mg Intravenous Daily   heparin  5,000 Units Subcutaneous Q8H   lactulose  20 g Oral Daily   multivitamin with minerals  1 tablet Oral Daily   nicotine  21 mg Transdermal Daily   mouth rinse  15 mL Mouth Rinse 4 times per day   pantoprazole  40 mg Oral Daily   polyethylene glycol  17 g Oral Daily   potassium chloride  40 mEq Oral Once   predniSONE  10 mg Oral Q breakfast   sodium chloride flush  10-40 mL Intracatheter Q12H    BMET    Component Value Date/Time   NA 140 06/23/2022 0426   K 3.4 (L) 06/23/2022 0426   CL 104 06/23/2022 0426   CO2 25 06/23/2022 0426   GLUCOSE 118 (  H) 06/23/2022 0426   BUN 53 (H) 06/23/2022 0426   CREATININE 3.15 (H) 06/23/2022 0426   CALCIUM 8.5 (L) 06/23/2022 0426   GFRNONAA 23 (L) 06/23/2022 0426   GFRAA  02/12/2010 0949    >60        The eGFR has been calculated using the MDRD equation. This calculation has not been validated in all clinical situations. eGFR's persistently <60 mL/min signify possible Chronic Kidney Disease.   CBC    Component Value Date/Time   WBC 7.0 06/21/2022 0410   RBC 2.40 (L) 06/21/2022 0410   HGB 8.3 (L) 06/21/2022 0410   HCT 22.9 (L) 06/21/2022 0410   PLT 315 06/21/2022 0410   MCV 95.4 06/21/2022 0410   MCH 34.6 (H) 06/21/2022 0410   MCHC 36.2 (H) 06/21/2022 0410   RDW 14.5 06/21/2022 0410   LYMPHSABS 0.7 06/21/2022 0410   MONOABS 0.6 06/21/2022 0410   EOSABS 0.0 06/21/2022 0410   BASOSABS 0.0 06/21/2022 0410     Assessment/ Plan: AKI - suspect secondary to prolonged pre-renal insults leading to ATN in setting  of alcohol abuse and severe pneumonia.Marland Kitchen required pressors on two different occasions for 48 hrs or less. Now is normotensive. 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  Serologies were all wnl (ANA, ANCA, GBM and C3/C4). Patient got CRRT 1/05 - 1/08.  Pt moved to [facility] and had iHD once here on 1/10 for acute SOB. UOP has significantly improved and Scr has continued to improve over the last 4 days.  Temp HD catheter removed 06/22/22.  Scr now 3.15 and Foley cath is out.  Nothing further to add and will sign off.  Will arrange for outpatient follow up with Dr. Royce Macadamia in the next 2-3 weeks. Acute resp failure/ vol overload - last HD 1/10 w/ 3 L UF.  Resp issues resolved. Euvolemic on exam now.  Multifocal/ strep pneumoniae w/ bacteremia - IV ampicillin per primary team.   AMS - multifactorial w/ AKI, alcohol abuse . Improving daily. Anemia - of critical illness.  Transfuse prn. Alcohol abuse/ ^LFT's - withdrawal protocol per primary team   Donetta Potts, MD [facility]

## 2022-06-24 DIAGNOSIS — J9601 Acute respiratory failure with hypoxia: Secondary | ICD-10-CM | POA: Diagnosis not present

## 2022-06-24 LAB — BASIC METABOLIC PANEL
Anion gap: 9 (ref 5–15)
BUN: 37 mg/dL — ABNORMAL HIGH (ref 6–20)
CO2: 25 mmol/L (ref 22–32)
Calcium: 8.1 mg/dL — ABNORMAL LOW (ref 8.9–10.3)
Chloride: 106 mmol/L (ref 98–111)
Creatinine, Ser: 2.61 mg/dL — ABNORMAL HIGH (ref 0.61–1.24)
GFR, Estimated: 28 mL/min — ABNORMAL LOW (ref >60)
Glucose, Bld: 113 mg/dL — ABNORMAL HIGH (ref 70–99)
Potassium: 3.6 mmol/L (ref 3.5–5.1)
Sodium: 140 mmol/L (ref 135–145)

## 2022-06-24 LAB — GLUCOSE, CAPILLARY
Glucose-Capillary: 88 mg/dL (ref 70–99)
Glucose-Capillary: 91 mg/dL (ref 70–99)
Glucose-Capillary: 108 mg/dL — ABNORMAL HIGH (ref 70–99)

## 2022-06-24 MED ORDER — THIAMINE MONONITRATE 100 MG PO TABS
100.0000 mg | ORAL_TABLET | Freq: Every day | ORAL | Status: DC
  Administered 2022-06-24 – 2022-06-25 (×2): 100 mg via ORAL
  Filled 2022-06-24 (×2): qty 1

## 2022-06-24 NOTE — Discharge Summary (Signed)
PROGRESS NOTE  Freeman O Heritage  DOB: July 12, 1970  PCP: Patient, No Pcp Per IEP:[id number]  DOA: 06/10/2022  LOS: 13 days  Hospital Day: 15  Brief narrative: James [name] is a 52 y.o. male with PMH significant for chronic alcoholism, chronic smoking, pancreatitis. 1/3, patient presented to [facility] ED with complaint of abdominal pain, shortness of breath In the ED, patient was found to be extremely short of breath, tachypneic, requiring high flow oxygen.   Workup revealed creatinine significantly elevated to 7.74 Chest x-ray showed right middle lobe and bibasilar pneumonia Patient was started on IV antibiotics, IV fluid for sepsis Admitted to [facility] Nephrology consultation was obtained. Workup included ANA/ANCA/GBM/complement levels all of which were unremarkable.   1/5, patient was started on CRRT 1/8, right IJ hemodialysis catheter was placed 1/10, transferred to [facility] for maintenance hemodialysis  Of note, blood cultures sent on admission showed Streptococcus pneumonia bacteremia in 1 out of 4 bottles TTE 1/8 was concerning for endocarditis on the anterior leaflet of mitral valve.  Cardiology was consulted for consideration of TEE See below for details  Subjective: Patient was seen and examined this morning. Lying down in bed.  Not in distress.  No new symptoms.  Pending placement.  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   Blood cultures sent on admission showed Streptococcus pneumonia bacteremia in 1 out of 4 bottles TTE 1/8 was concerning for endocarditis on the anterior leaflet of mitral valve.  Cardiology was consulted for consideration of TEE Repeat blood cultures sent on 1/8 does not show any growth so far. No recurrence of fever.  WBC count improved Initially treated with IV antibiotics.  Per ID  recommendation, antibiotics switched to oral Augmentin on 1/16 for 7 days since removal of IJ catheter on 1/15 (EOT 1/22) Recent Labs  Lab 06/20/22 0311 06/21/22 0410  WBC 6.2 7.0    Acute kidney injury (AKI) Presented with creatinine significantly elevated to 7.74  Nephrology consult appreciated. 1/5, patient was started on CRRT 1/8, right IJ hemodialysis catheter was placed 1/10, transferred to [facility] for maintenance hemodialysis Workup thus far negative including negative ANA's, ANCA, GBM, complement levels Fortunately, he started to make urine again.  Creatinine improving daily without ongoing hydration. Recent Labs    06/15/22 1543 06/16/22 0437 06/17/22 0426 06/18/22 0334 06/19/22 0846 06/20/22 0311 06/21/22 0410 06/22/22 0516 06/23/22 0426 06/24/22 0904  BUN 22* 35* 55* 37* 61* 75* 73* 66* 53* 37*  CREATININE 2.06* 3.26* 4.42* 3.42* 4.61* 4.84* 4.71* 4.13* 3.15* 2.61*    Acute anemia. Currently hemoglobin is stable between 8 and 9.  Continue to monitor Recent Labs    06/11/22 1027 06/12/22 0304 06/15/22 0244 06/16/22 0437 06/17/22 0426 06/20/22 0311 06/21/22 0410  HGB  --    < > 11.1* 10.3* 10.4* 8.5* 8.3*  MCV  --    < > 97.6 95.5 96.7 93.9 95.4  VITAMINB12 287  --   --   --   --   --   --    < > = values in this interval not displayed.    Acute metabolic encephalopathy multifactorial secondary to infection, uremia TSH, vitamin B12, ammonia, drug screen, VBG unremarkable on 1/4 CT head 1/3 unremarkable  Mental status gradually improved.   Alcohol abuse Patient was managed with CIWA protocol for the first several days of hospitalization and has since been discontinued.  1/13, ultrasound right upper quadrant did not show any evidence of cirrhosis or ascites.  Abdominal distention improved.   Protein-calorie malnutrition, moderate  Nutrition following, their input is appreciated Boost nutritional supplements Assisting patient with all meals Daily  multivitamin   Gouty arthritis of right great toe Identified 1/8 and confirmed via podiatry consultation at that time with Dr. Blenda Mounts Patient was started on prednisone tapering course.  Continue to taper.   Nicotine dependence, cigarettes, uncomplicated Providing patient with daily nicotine patches   Abdominal ileus Improved with IV Reglan PRN.  Currently tolerating regular diet.   Mobility: Encourage ambulation.  PT evaluation obtained.  SNF recommended  Goals of care   Code Status: Full Code    Scheduled Meds:  amoxicillin-clavulanate  1 tablet Oral BID   Chlorhexidine Gluconate Cloth  6 each Topical Q2200   Chlorhexidine Gluconate Cloth  6 each Topical Q0600   docusate sodium  100 mg Oral BID   folic acid  1 mg Intravenous Daily   heparin  5,000 Units Subcutaneous Q8H   lactulose  20 g Oral Daily   multivitamin with minerals  1 tablet Oral Daily   nicotine  21 mg Transdermal Daily   mouth rinse  15 mL Mouth Rinse 4 times per day   pantoprazole  40 mg Oral Daily   polyethylene glycol  17 g Oral Daily   predniSONE  10 mg Oral Q breakfast   sodium chloride flush  10-40 mL Intracatheter Q12H   thiamine  100 mg Oral Daily    PRN meds: acetaminophen **OR** acetaminophen, guaiFENesin-dextromethorphan, hydrALAZINE, LORazepam, ondansetron **OR** ondansetron (ZOFRAN) IV, mouth rinse, sodium chloride flush   Infusions:     Skin assessment:  Pressure Injury 06/14/22 Buttocks Right;Upper Stage 2 -  Partial thickness loss of dermis presenting as a shallow open injury with a red, pink wound bed without slough. Pea size (Active)  06/14/22 0500  Location: Buttocks  Location Orientation: Right;Upper  Staging: Stage 2 -  Partial thickness loss of dermis presenting as a shallow open injury with a red, pink wound bed without slough.  Wound Description (Comments): Pea size  Present on Admission:     Nutritional status:  Body mass index is 19.51 kg/m.  Nutrition Problem:  Moderate Malnutrition Etiology: chronic illness (alcohol abuse and pancreatitis) Signs/Symptoms: moderate fat depletion, moderate muscle depletion     Diet:  Diet Order             Diet regular Room service appropriate? Yes; Fluid consistency: Thin  Diet effective now                   DVT prophylaxis:  heparin injection 5,000 Units Start: 06/11/22 0800   Antimicrobials: Oral Augmentin Fluid: None Consultants: Nephro, cardiology, ID Family Communication: None at bedside  Status is: Inpatient  Continue in-hospital care because: Pending SNF Level of care: Telemetry Medical   Dispo: The patient is from: Home              Anticipated d/c is to: Pending SNF              Patient currently is medically stable to d/c.   Difficult to place patient No    Antimicrobials: Anti-infectives (From admission, onward)    Start     Dose/Rate Route Frequency Ordered Stop   06/23/22 1000  amoxicillin-clavulanate (AUGMENTIN) 500-125 MG per tablet 1 tablet        1 tablet Oral 2 times daily 06/23/22 0815 06/30/22 0959  06/16/22 1000  ampicillin (OMNIPEN) 2 g in sodium chloride 0.9 % 100 mL IVPB  Status:  Discontinued        2 g 300 mL/hr over 20 Minutes Intravenous Every 8 hours 06/16/22 0808 06/23/22 0815   06/15/22 1500  ampicillin (OMNIPEN) 2 g in sodium chloride 0.9 % 100 mL IVPB  Status:  Discontinued        2 g 300 mL/hr over 20 Minutes Intravenous Every 6 hours 06/15/22 1404 06/16/22 0808   06/13/22 2200  ampicillin (OMNIPEN) 2 g in sodium chloride 0.9 % 100 mL IVPB  Status:  Discontinued        2 g 300 mL/hr over 20 Minutes Intravenous Every 8 hours 06/13/22 1208 06/15/22 1404   06/12/22 0800  cefTRIAXone (ROCEPHIN) 2 g in sodium chloride 0.9 % 100 mL IVPB  Status:  Discontinued        2 g 200 mL/hr over 30 Minutes Intravenous Every 24 hours 06/11/22 1650 06/13/22 1208   06/11/22 2200  linezolid (ZYVOX) IVPB 600 mg  Status:  Discontinued        600 mg 300 mL/hr over 60  Minutes Intravenous Every 12 hours 06/11/22 1100 06/11/22 1307   06/11/22 1200  ceFEPIme (MAXIPIME) 1 g in sodium chloride 0.9 % 100 mL IVPB  Status:  Discontinued        1 g 200 mL/hr over 30 Minutes Intravenous Every 24 hours 06/11/22 1034 06/11/22 1648   06/11/22 1200  linezolid (ZYVOX) IVPB 600 mg  Status:  Discontinued        600 mg 300 mL/hr over 60 Minutes Intravenous  Once 06/11/22 1047 06/11/22 1351   06/11/22 1100  metroNIDAZOLE (FLAGYL) IVPB 500 mg  Status:  Discontinued        500 mg 100 mL/hr over 60 Minutes Intravenous Every 12 hours 06/11/22 1006 06/13/22 1208   06/10/22 2330  cefTRIAXone (ROCEPHIN) 2 g in sodium chloride 0.9 % 100 mL IVPB  Status:  Discontinued        2 g 200 mL/hr over 30 Minutes Intravenous Every 24 hours 06/10/22 2329 06/11/22 1004   06/10/22 2330  azithromycin (ZITHROMAX) 500 mg in sodium chloride 0.9 % 250 mL IVPB  Status:  Discontinued        500 mg 250 mL/hr over 60 Minutes Intravenous Every 24 hours 06/10/22 2329 06/11/22 1004       Objective: Vitals:   06/24/22 0605 06/24/22 0737  BP: 126/76 105/65  Pulse: 72 88  Resp: 18 20  Temp: 98 F (36.7 C) 98.4 F (36.9 C)  SpO2: 100% 100%    Intake/Output Summary (Last 24 hours) at 06/24/2022 1419 Last data filed at 06/24/2022 1345 Gross per 24 hour  Intake 720 ml  Output 1550 ml  Net -830 ml    Filed Weights   06/20/22 2108 06/22/22 0500 06/23/22 0500  Weight: 62 kg 62.4 kg 58.2 kg   Weight change:  Body mass index is 19.51 kg/m.   Physical Exam: General exam: Pleasant, middle-aged African-American male.  Not in pain. Skin: No rashes, lesions or ulcers. HEENT: Atraumatic, normocephalic, no obvious bleeding Lungs: Clear to auscultation bilaterally CVS: Regular rate and rhythm, no murmur GI/Abd soft, distended and tenderness improved, bowel sound present CNS: Alert, awake, oriented x 3 Psychiatry: Mood appropriate Extremities: No pedal edema, no calf tenderness  Data Review: I  have personally reviewed the laboratory data and studies available.  F/u labs ordered Unresulted Labs (From admission, onward)  None       Total time spent in review of labs and imaging, patient evaluation, formulation of plan, documentation and communication with family 25 minutes  Signed, Lorin Glass, MD [facility] 06/24/2022

## 2022-06-25 ENCOUNTER — Other Ambulatory Visit (HOSPITAL_COMMUNITY): Payer: Self-pay

## 2022-06-25 DIAGNOSIS — J9601 Acute respiratory failure with hypoxia: Secondary | ICD-10-CM | POA: Diagnosis not present

## 2022-06-25 LAB — GLUCOSE, CAPILLARY
Glucose-Capillary: 90 mg/dL (ref 70–99)
Glucose-Capillary: 119 mg/dL — ABNORMAL HIGH (ref 70–99)

## 2022-06-25 MED ORDER — DOCUSATE SODIUM 100 MG PO CAPS: 100.0000 mg | ORAL_CAPSULE | Freq: Two times a day (BID) | ORAL | 0 refills | Status: DC

## 2022-06-25 MED ORDER — FOLIC ACID 1 MG PO TABS: 1.0000 mg | ORAL_TABLET | Freq: Every day | ORAL | Status: DC

## 2022-06-25 MED ORDER — ADULT MULTIVITAMIN W/MINERALS CH: 1.0000 | ORAL_TABLET | Freq: Every day | ORAL | Status: DC

## 2022-06-25 MED ORDER — GUAIFENESIN-DM 100-10 MG/5ML PO SYRP: 5.0000 mL | ORAL_SOLUTION | ORAL | 0 refills | Status: DC | PRN

## 2022-06-25 MED ORDER — PREDNISONE 5 MG PO TABS: 5.0000 mg | ORAL_TABLET | Freq: Every day | ORAL | Status: DC

## 2022-06-25 MED ORDER — ADULT MULTIVITAMIN W/MINERALS CH
1.0000 | ORAL_TABLET | Freq: Every day | ORAL | 2 refills | Status: AC
  Filled 2022-06-25: qty 30, 30d supply, fill #0

## 2022-06-25 MED ORDER — AMOXICILLIN-POT CLAVULANATE 500-125 MG PO TABS
1.0000 | ORAL_TABLET | Freq: Two times a day (BID) | ORAL | 0 refills | Status: AC
  Filled 2022-06-25: qty 8, 4d supply, fill #0

## 2022-06-25 MED ORDER — AMOXICILLIN-POT CLAVULANATE 500-125 MG PO TABS: 1.0000 | ORAL_TABLET | Freq: Two times a day (BID) | ORAL | Status: DC

## 2022-06-25 MED ORDER — ACETAMINOPHEN 325 MG PO TABS: 650.0000 mg | ORAL_TABLET | Freq: Four times a day (QID) | ORAL | Status: AC | PRN

## 2022-06-25 MED ORDER — FOLIC ACID 1 MG PO TABS
1.0000 mg | ORAL_TABLET | Freq: Every day | ORAL | 2 refills | Status: DC
  Filled 2022-06-25: qty 30, 30d supply, fill #0

## 2022-06-25 MED ORDER — ACETAMINOPHEN 325 MG PO TABS: 650.0000 mg | ORAL_TABLET | Freq: Four times a day (QID) | ORAL | Status: DC | PRN

## 2022-06-25 NOTE — TOC Progression Note (Addendum)
Transition of Care [facility]) - Initial/Assessment Note    Patient Details  Name: James [name] MRN: [id number] Date of Birth: 05/18/1971  Transition [facility]) CM/SW Contact:    Milinda Antis, [place] Phone Number: 06/25/2022, 8:48 AM  Clinical Narrative:                 LCSW contacted Christine with central intake for [facility] to inquire about the determination  of the bedside assessment.  LCSW was informed that LCSW would need to contact the person to schedule a bedside assessment.  LCSW expressed that this was not the process that was explained earlier in the week when LCSW spoke with same person and LCSW was told that [name] would contact the person for the bedside assessment.  [name] explained that this is a new process that is being implemented.    LCSW contacted Shaun with [facility] to schedule a bedside assessment.  Shaun reports that he will visit the patient today.  13:45- The facility met with the patient for bedside evaluation.  The patient informed admissions at the facility that he now wants to go home with home health.  Care team notified.    TOC following.   Expected Discharge Plan: Home/Self Care Barriers to Discharge: Continued Medical Work up   Patient Goals and CMS Choice            Expected Discharge Plan and Services       Living arrangements for the past 2 months: Single Family Home                                      Prior Living Arrangements/Services Living arrangements for the past 2 months: Single Family Home                     Activities of Daily Living Home Assistive Devices/Equipment: None ADL Screening (condition at time of admission) Patient's cognitive ability adequate to safely complete daily activities?: No Is the patient deaf or have difficulty hearing?: No Does the patient have difficulty seeing, even when wearing glasses/contacts?: No Does the patient have difficulty concentrating, remembering, or  making decisions?: Yes Patient able to express need for assistance with ADLs?: Yes Does the patient have difficulty dressing or bathing?: No Independently performs ADLs?: Yes (appropriate for developmental age) Does the patient have difficulty walking or climbing stairs?: No Weakness of Legs: None Weakness of Arms/Hands: None  Permission Sought/Granted                  Emotional Assessment              Admission diagnosis:  Acute respiratory failure with hypoxia ([place]) [J96.01] AKI (acute kidney injury) ([place]) [N17.9] Multifocal pneumonia [J18.9] Sepsis, due to unspecified organism, unspecified whether acute organ dysfunction present [facility]) [A41.9] Patient Active Problem List   Diagnosis Date Noted   Protein-calorie malnutrition, moderate ([place]) 06/17/2022   Sepsis due to Streptococcus pneumoniae ([place]) 06/17/2022   Pressure injury of skin 16/06/930   Acute metabolic encephalopathy 35/57/3220   Alcohol abuse 06/17/2022   Gouty arthritis of right great toe 06/17/2022   Nicotine dependence, cigarettes, uncomplicated 25/42/7062   Acute kidney injury (AKI) with acute tubular necrosis (ATN) ([place]) 06/12/2022   Acute respiratory failure with hypoxia ([place]) 06/11/2022   PCP:  Patient, No Pcp Per Pharmacy:   [facility] [place] [facility]  [place] [address] Phone: [phone] Fax: [phone] - [place], [place] [address] Phone: [phone] Fax: [phone]     Social Determinants of Health (SDOH) Social History: SDOH Screenings   Food Insecurity: No Food Insecurity (06/15/2022)  Housing: Low Risk  (06/18/2022)  Transportation Needs: No Transportation Needs (06/23/2022)  Utilities: Not At Risk (06/23/2022)  Tobacco Use: High Risk (06/20/2022)   SDOH Interventions:     Readmission Risk Interventions     No data to display

## 2022-06-25 NOTE — TOC Transition Note (Addendum)
Transition of Care [facility]) - CM/SW Discharge Note   Patient Details  Name: James [name] MRN: [id number] Date of Birth: 09/13/1970  Transition [facility]) CM/SW Contact:  Tom-Johnson, Renea Ee, RN Phone Number: 06/25/2022, 2:01 PM   Clinical Narrative:     Patient is scheduled for discharge today. Declined SNF, requested home health. CM spoke with Patient and mother at bedside about home health choices from [email or web address]. Patient chose [facility], referral called in to [facility]. Cory informed CM, patient and  patient's mother that patient has to pay a co-pay as he has Pharmacist, community. He will have to pay $75-$120 each visit. Patient states he cannot afford to pay and agrees to do outpatient. His mother states she will take him to outpatient therapy close to their house and chose [facility]. MD notified. Outpatient order placed, Info on AVS. RW ordered from [employer], Erasmo Downer to deliver at bedside.  Hospital f/u and new patient establishment scheduled with Internal Medicine, [place] did not have dates available, info on AVS. Mother to transport at discharge. No further TOC needs noted.       Final next level of care: Home w Home Health Services Barriers to Discharge: Barriers Resolved   Patient Goals and CMS Choice CMS Medicare.gov Compare Post Acute Care list provided to:: Patient Choice offered to / list presented to : Patient, Parent (Mother)  Discharge Placement                  Patient to be transferred to facility by: Mother      Discharge Plan and Services Additional resources added to the After Visit Summary for                  DME Arranged: Walker rolling DME Agency: [employer] Date DME Agency Contacted: 06/25/22 Time DME Agency Contacted: [id number] Representative spoke with at DME Agency: Erasmo Downer HH Arranged: PT, OT, Refused SNF [place] Agency: NA, [place] Date [place]: 06/25/22 Time [place]: 1354 Representative spoke with at [place]: [place] Determinants of Health ([place]) Interventions [place]: No Food Insecurity (06/15/2022)  Housing: Low Risk  (06/18/2022)  Transportation Needs: No Transportation Needs (06/23/2022)  Utilities: Not At Risk (06/23/2022)  Tobacco Use: High Risk (06/20/2022)     Readmission Risk Interventions     No data to display

## 2022-06-25 NOTE — Progress Notes (Signed)
Physical Therapy Treatment Patient Details Name: James [name] MRN: [id number] DOB: July 10, 1970 Today's Date: 06/25/2022   History of Present Illness Pt is a 52yo male presenting to [facility] on 1/3 with abdominal pain and vomiting. Imaging negative for acute finding. Admitted for evaluation of PNA and AKI. 1/5 CRRT started, stopped 1/8. Episode of SVT. 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  PMH: ETOH abuse, tobacco abuse, pancreatitis.    PT Comments    Shows notable improvement since last visit. Still unsteady but corrected adequately with use of a RW for support. Still with some cognitive deficits as well but now aware of deficits and safety. Mother present during this session and observed. Mother and sister feel they can adequately supervise patient at home who is now requesting HHPT over SNF. I feel this would be an appropriate avenue given his progress and level of independence today. Safely completed stair training. Team notified of change in recs. Will follow until d/c.   Recommendations for follow up therapy are one component of a multi-disciplinary discharge planning process, led by the attending physician.  Recommendations may be updated based on patient status, additional functional criteria and insurance authorization.  Follow Up Recommendations  Home health PT Can patient physically be transported by private vehicle: Yes   Assistance Recommended at Discharge Intermittent Supervision/Assistance  Patient can return home with the following Help with stairs or ramp for entrance;Assistance with cooking/housework;A little help with walking and/or transfers;A little help with bathing/dressing/bathroom;Direct supervision/assist for financial management;Direct supervision/assist for medications management;Assist for transportation   Equipment Recommendations  Rolling walker (2 wheels)    Recommendations for Other Services       Precautions / Restrictions Precautions Precautions:  Fall Precaution Comments: pt reports hx of falling Restrictions Weight Bearing Restrictions: No Other Position/Activity Restrictions: wbat     Mobility  Bed Mobility Overal bed mobility: Modified Independent             General bed mobility comments: no assist in and out of bed.    Transfers Overall transfer level: Needs assistance Equipment used: Rolling walker (2 wheels) Transfers: Sit to/from Stand Sit to Stand: Supervision           General transfer comment: Supervision for safety, cues for RW placement before rising and before sitting for proximity. Good power up to rise but using back of knees on bed for support. no physical assist needed.    Ambulation/Gait Ambulation/Gait assistance: Supervision Gait Distance (Feet): 225 Feet Assistive device: Rolling walker (2 wheels) Gait Pattern/deviations: Step-through pattern, Decreased stride length, Trunk flexed, Narrow base of support Gait velocity: decr Gait velocity interpretation: <1.8 ft/sec, indicate of risk for recurrent falls   General Gait Details: Educated on safe AD use with RW. Cues for proximity to device and upright posture. Cues to widen BOS. No buckling or overt LOB noted while using this device. Able for forward, backwar and perform sharp and narrow turns safely.   Stairs Stairs: Yes Stairs assistance: Min guard Stair Management: One rail Left, Forwards, Step to pattern Number of Stairs: 13 General stair comments: Educated on stair navigation. Pt requires VC for sequencing, min guard for safety but no LOB noted during training. Step to pattern. Able to ste up and down with each foot leading but prefers down with LT, up with RT. Had pt teach back for recall.   Wheelchair Mobility    Modified Rankin (Stroke Patients Only)       Balance Overall balance assessment: Needs assistance Sitting-balance support: Feet  supported, No upper extremity supported Sitting balance-Leahy Scale: Fair      Standing balance support: No upper extremity supported Standing balance-Leahy Scale: Fair Standing balance comment: Progressed to standing with no AD but stable with RW.                            Cognition Arousal/Alertness: Awake/alert Behavior During Therapy: WFL for tasks assessed/performed Overall Cognitive Status: Impaired/Different from baseline Area of Impairment: Following commands, Problem solving                       Following Commands: Follows one step commands consistently, Follows one step commands with increased time, Follows multi-step commands inconsistently     Problem Solving: Slow processing, Requires verbal cues          Exercises      General Comments General comments (skin integrity, edema, etc.): Mother present agrees pt moving better, family feels they can provide supervision.      Pertinent Vitals/Pain Pain Assessment Pain Assessment: No/denies pain    Home Living                          Prior Function            PT Goals (current goals can now be found in the care plan section) Acute Rehab PT Goals Patient Stated Goal: To get stronger PT Goal Formulation: With patient Time For Goal Achievement: 06/30/22 Potential to Achieve Goals: Fair Progress towards PT goals: Progressing toward goals    Frequency    Min 2X/week      PT Plan Discharge plan needs to be updated    Co-evaluation              AM-PAC PT "6 Clicks" Mobility   Outcome Measure  Help needed turning from your back to your side while in a flat bed without using bedrails?: None Help needed moving from lying on your back to sitting on the side of a flat bed without using bedrails?: None Help needed moving to and from a bed to a chair (including a wheelchair)?: A Little Help needed standing up from a chair using your arms (e.g., wheelchair or bedside chair)?: A Little Help needed to walk in hospital room?: A Little Help needed  climbing 3-5 steps with a railing? : A Little 6 Click Score: 20    End of Session Equipment Utilized During Treatment: Gait belt Activity Tolerance: Patient tolerated treatment well Patient left: in bed;with call bell/phone within reach;with bed alarm set;with family/visitor present Nurse Communication: Mobility status PT Visit Diagnosis: Difficulty in walking, not elsewhere classified (R26.2);Muscle weakness (generalized) (M62.81);History of falling (Z91.81);Unsteadiness on feet (R26.81);Other abnormalities of gait and mobility (R26.89)     Time: 6967-8938 PT Time Calculation (min) (ACUTE ONLY): 28 min  Charges:  $Gait Training: 23-37 mins                     Candie Mile, PT, DPT Physical Therapist Acute Rehabilitation Services [facility]    Ellouise Newer 06/25/2022, 2:24 PM

## 2022-06-25 NOTE — Discharge Summary (Addendum)
Physician Discharge Summary  James [name] DJM:[id number] DOB: 07/04/1970 DOA: 06/10/2022  PCP: Patient, No Pcp Per  Admit date: 06/10/2022 Discharge date: 06/25/2022  Admitted From: Home Discharge disposition: Home with home with PT  Recommendations at discharge:  Complete the course of antibiotics on 1/22 Stop using alcohol   Brief narrative: James [name] is a 52 y.o. male with PMH significant for chronic alcoholism, chronic smoking, pancreatitis. 1/3, patient presented to [facility] ED with complaint of abdominal pain, shortness of breath In the ED, patient was found to be extremely short of breath, tachypneic, requiring high flow oxygen.   Workup revealed creatinine significantly elevated to 7.74 Chest x-ray showed right middle lobe and bibasilar pneumonia Patient was started on IV antibiotics, IV fluid for sepsis Admitted to [facility] Nephrology consultation was obtained. Workup included ANA/ANCA/GBM/complement levels all of which were unremarkable.   1/5, patient was started on CRRT 1/8, right IJ hemodialysis catheter was placed 1/10, transferred to [facility] for maintenance hemodialysis  Of note, blood cultures sent on admission showed Streptococcus pneumonia bacteremia in 1 out of 4 bottles TTE 1/8 was concerning for endocarditis on the anterior leaflet of mitral valve.  Cardiology was consulted for consideration of TEE See below for details  Subjective: Patient was seen and examined this morning. Lying down in bed.  Not in distress.  No new symptoms.    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   Blood cultures sent on admission showed Streptococcus pneumonia bacteremia in 1 out of 4 bottles TTE 1/8 was concerning for endocarditis on the anterior leaflet of mitral valve.  Cardiology was consulted for  consideration of TEE Repeat blood cultures sent on 1/8 does not show any growth so far. No recurrence of fever.  WBC count improved Initially treated with IV antibiotics.  Per ID recommendation, antibiotics switched to oral Augmentin on 1/16 for 7 days since removal of IJ catheter on 1/15 (EOT 1/22) Recent Labs  Lab 06/20/22 0311 06/21/22 0410  WBC 6.2 7.0   Acute kidney injury (AKI) Presented with creatinine significantly elevated to 7.74  Nephrology consult appreciated. 1/5, patient was started on CRRT 1/8, right IJ hemodialysis catheter was placed 1/10, transferred to [facility] for maintenance hemodialysis Fortunately his renal function improved gradually.  He is no longer requiring hemodialysis.  Creatinine improving, trend as below. Workup thus far negative including negative ANA's, ANCA, GBM, complement levels Recent Labs    06/15/22 1543 06/16/22 0437 06/17/22 0426 06/18/22 0334 06/19/22 0846 06/20/22 0311 06/21/22 0410 06/22/22 0516 06/23/22 0426 06/24/22 0904  BUN 22* 35* 55* 37* 61* 75* 73* 66* 53* 37*  CREATININE 2.06* 3.26* 4.42* 3.42* 4.61* 4.84* 4.71* 4.13* 3.15* 2.61*   Acute anemia. Currently hemoglobin is stable between 8 and 9.  Continue to monitor as an outpatient. Recent Labs    06/11/22 1027 06/12/22 0304 06/15/22 0244 06/16/22 0437 06/17/22 0426 06/20/22 0311 06/21/22 0410  HGB  --    < > 11.1* 10.3* 10.4* 8.5* 8.3*  MCV  --    < > 97.6 95.5 96.7 93.9 95.4  VITAMINB12 287  --   --   --   --   --   --    < > = values in this interval not displayed.   Acute metabolic encephalopathy multifactorial secondary to infection, uremia TSH, vitamin B12, ammonia, drug screen, VBG unremarkable on 1/4 CT head  1/3 unremarkable  Mental status gradually improved.   Alcohol abuse Patient was managed with CIWA protocol for the first several days of hospitalization and has since been discontinued. 1/13, ultrasound right upper quadrant did not show any  evidence of cirrhosis or ascites.  Abdominal distention improved.   Protein-calorie malnutrition, moderate  Nutrition consult appreciated. Continue multivitamin and dietary supplements.   Gouty arthritis of right great toe Identified 1/8 and confirmed via podiatry consultation at that time with Dr. Blenda Mounts Patient was started on prednisone tapering course.  No need to continue post discharge.   Nicotine dependence, cigarettes, uncomplicated Providing patient with daily nicotine patches   Mobility: Encourage ambulation.  PT evaluation obtained. Patient was reevaluated by PT today.  Recommendation was changed to home with home health PT.  Goals of care   Code Status: Full Code   Wounds:  - Pressure Injury 06/14/22 Buttocks Right;Upper Stage 2 -  Partial thickness loss of dermis presenting as a shallow open injury with a red, pink wound bed without slough. Pea size (Active)  Date First Assessed/Time First Assessed: 06/14/22 0500   Location: Buttocks  Location Orientation: Right;Upper  Staging: Stage 2 -  Partial thickness loss of dermis presenting as a shallow open injury with a red, pink wound bed without slough.  Wound ...    Assessments 06/14/2022  5:08 AM 06/25/2022  9:00 AM  Dressing Type -- Foam - Lift dressing to assess site every shift  Dressing -- Clean, Dry, Intact  Wound Length (cm) 2 cm --  Wound Width (cm) 2 cm --  Wound Depth (cm) 0 cm --  Wound Surface Area (cm^2) 4 cm^2 --  Wound Volume (cm^3) 0 cm^3 --     No associated orders.    Discharge Exam:   Vitals:   06/24/22 1653 06/24/22 2139 06/25/22 0449 06/25/22 0910  BP: 106/70 (!) 106/56 117/75 120/77  Pulse: 86 83 77 78  Resp: 20 18 18 18   Temp: 98.1 F (36.7 C) 98 F (36.7 C) 98 F (36.7 C) 98.2 F (36.8 C)  TempSrc: Oral Oral Oral Oral  SpO2: 100% 100% 100% 100%  Weight:      Height:        Body mass index is 19.51 kg/m.   General exam: Pleasant, middle-aged African-American male.  Not in  pain. Skin: No rashes, lesions or ulcers. HEENT: Atraumatic, normocephalic, no obvious bleeding Lungs: Clear to auscultation bilaterally CVS: Regular rate and rhythm, no murmur GI/Abd soft, distended and tenderness improved, bowel sound present CNS: Alert, awake, oriented x 3 Psychiatry: Mood appropriate Extremities: No pedal edema, no calf tenderness  Follow ups:    Follow-up [facility].   Contact information: [address] [place] [id number] [phone]                Discharge Instructions:   Discharge Instructions     Call MD for:  difficulty breathing, headache or visual disturbances   Complete by: As directed    Call MD for:  extreme fatigue   Complete by: As directed    Call MD for:  hives   Complete by: As directed    Call MD for:  persistant dizziness or light-headedness   Complete by: As directed    Call MD for:  persistant nausea and vomiting   Complete by: As directed    Call MD for:  severe uncontrolled pain   Complete  by: As directed    Call MD for:  temperature >100.4   Complete by: As directed    Diet general   Complete by: As directed    Discharge instructions   Complete by: As directed    Recommendations at discharge:   Complete the course of antibiotics on 1/22  Stop using alcohol  General discharge instructions: Follow with Primary MD Patient, No Pcp Per in 7 days  Please request your PCP  to go over your hospital tests, procedures, radiology results at the follow up. Please get your medicines reviewed and adjusted.  Your PCP may decide to repeat certain labs or tests as needed. Do not drive, operate heavy machinery, perform activities at heights, swimming or participation in water activities or provide baby sitting services if your were admitted for syncope or siezures until you have seen by Primary MD or a Neurologist and advised to do so  again. [place] Controlled Substance Reporting System database was reviewed. Do not drive, operate heavy machinery, perform activities at heights, swim, participate in water activities or provide baby-sitting services while on medications for pain, sleep and mood until your outpatient physician has reevaluated you and advised to do so again.  You are strongly recommended to comply with the dose, frequency and duration of prescribed medications. Activity: As tolerated with Full fall precautions use walker/cane & assistance as needed Avoid using any recreational substances like cigarette, tobacco, alcohol, or non-prescribed drug. If you experience worsening of your admission symptoms, develop shortness of breath, life threatening emergency, suicidal or homicidal thoughts you must seek medical attention immediately by calling 911 or calling your MD immediately  if symptoms less severe. You must read complete instructions/literature along with all the possible adverse reactions/side effects for all the medicines you take and that have been prescribed to you. Take any new medicine only after you have completely understood and accepted all the possible adverse reactions/side effects.  Wear Seat belts while driving. You were cared for by a hospitalist during your hospital stay. If you have any questions about your discharge medications or the care you received while you were in the hospital after you are discharged, you can call the unit and ask to speak with the hospitalist or the covering physician. Once you are discharged, your primary care physician will handle any further medical issues. Please note that NO REFILLS for any discharge medications will be authorized once you are discharged, as it is imperative that you return to your primary care physician (or establish a relationship with a primary care physician if you do not have one).   Discharge wound care:   Complete by: As directed    Increase  activity slowly   Complete by: As directed        Discharge Medications:   Allergies as of 06/25/2022   No Known Allergies      Medication List     TAKE these medications    acetaminophen 325 MG tablet Commonly known as: TYLENOL Take 2 tablets (650 mg total) by mouth every 6 (six) hours as needed for mild pain (or Fever >/= 101).   amoxicillin-clavulanate 500-125 MG tablet Commonly known as: AUGMENTIN Take 1 tablet by mouth 2 (two) times daily for 4 days.   folic acid 1 MG tablet Commonly known as: FOLVITE Take 1 tablet (1 mg total) by mouth daily. Start taking on: June 26, 2022   multivitamin with minerals Tabs tablet Take 1 tablet by mouth daily. Start taking on: June 26, 2022               Durable Medical Equipment  (From admission, onward)           Start     Ordered   06/25/22 1410  For home use only DME Walker rolling  Once       Question Answer Comment  Walker: With 5 Inch Wheels   Patient needs a walker to treat with the following condition Gait instability      06/25/22 1410              Discharge Care Instructions  (From admission, onward)           Start     Ordered   06/25/22 0000  Discharge wound care:        06/25/22 1139             The results of significant diagnostics from this hospitalization (including imaging, microbiology, ancillary and laboratory) are listed below for reference.    Procedures and Diagnostic Studies:   US RENAL  Result Date: 06/11/2022 CLINICAL DATA:  Acute kidney injury. EXAM: RENAL / URINARY TRACT ULTRASOUND COMPLETE COMPARISON:  December 05, 2007. FINDINGS: Right Kidney: Renal measurements: 13.2 x 5.7 x 5.5 cm = volume: 217 mL. Echogenicity within normal limits. No mass or hydronephrosis visualized. Left Kidney: Renal measurements: 10.2 x 6.1 x 5.8 cm = volume: 188 mL. Echogenicity within normal limits. No mass or hydronephrosis visualized. Bladder: Not well visualized due to body  habitus. Other: None. IMPRESSION: No significant renal abnormality seen. Electronically Signed   By: Lupita Raider M.D.   On: 06/11/2022 11:36   DG Chest Portable 1 View  Result Date: 06/11/2022 CLINICAL DATA:  Increasing shortness of breath EXAM: PORTABLE CHEST 1 VIEW COMPARISON:  06/10/2022 FINDINGS: Cardiac shadow is within normal limits. Increasing bibasilar consolidation is noted when compared with the prior exam. No bony abnormality is noted. IMPRESSION: Increasing bibasilar consolidation. Electronically Signed   By: Alcide Clever M.D.   On: 06/11/2022 03:00   CT Head Wo Contrast  Result Date: 06/10/2022 CLINICAL DATA:  Dizziness. EXAM: CT HEAD WITHOUT CONTRAST TECHNIQUE: Contiguous axial images were obtained from the base of the skull through the vertex without intravenous contrast. RADIATION DOSE REDUCTION: This exam was performed according to the departmental dose-optimization program which includes automated exposure control, adjustment of the mA and/or kV according to patient size and/or use of iterative reconstruction technique. COMPARISON:  None Available. FINDINGS: Brain: There is mild cerebral atrophy with widening of the extra-axial spaces and ventricular dilatation. There are areas of decreased attenuation within the white matter tracts of the supratentorial brain, consistent with microvascular disease changes. Vascular: No hyperdense vessel or unexpected calcification. Skull: Normal. Negative for fracture or focal lesion. Sinuses/Orbits: No acute finding. Other: None. IMPRESSION: 1. No acute intracranial abnormality. 2. Cerebral atrophy and microvascular disease changes of the supratentorial brain. Electronically Signed   By: Aram Candela M.D.   On: 06/10/2022 23:46   DG Chest 2 View  Result Date: 06/10/2022 CLINICAL DATA:  Shortness of breath and upper abdominal pain. EXAM: CHEST - 2 VIEW COMPARISON:  None Available. FINDINGS: The cardiac silhouette is mildly enlarged. There is mild  calcification of the aortic arch. Moderate severity infiltrates are seen within the right middle lobe and bilateral lung bases. There is no evidence of a pleural effusion or pneumothorax. The visualized skeletal structures are unremarkable. IMPRESSION: Moderate severity right middle lobe and bibasilar infiltrates. Electronically  Signed   By: Virgina Norfolk M.D.   On: 06/10/2022 23:34   CT ABDOMEN PELVIS WO CONTRAST  Result Date: 06/10/2022 CLINICAL DATA:  Abdominal pain for 1 week, history of pancreatitis, alcohol use EXAM: CT ABDOMEN AND PELVIS WITHOUT CONTRAST TECHNIQUE: Multidetector CT imaging of the abdomen and pelvis was performed following the standard protocol without IV contrast. RADIATION DOSE REDUCTION: This exam was performed according to the departmental dose-optimization program which includes automated exposure control, adjustment of the mA and/or kV according to patient size and/or use of iterative reconstruction technique. COMPARISON:  12/04/2007 FINDINGS: Lower chest: Very extensive, heterogeneous and consolidative airspace opacity throughout the included lung bases (series 6, image 14). Hepatobiliary: No solid liver abnormality is seen. Hepatic steatosis. No gallstones, gallbladder wall thickening, or biliary dilatation. Pancreas: Unremarkable. No pancreatic ductal dilatation or surrounding inflammatory changes. Spleen: Normal in size without significant abnormality. Adrenals/Urinary Tract: Adrenal glands are unremarkable. Kidneys are normal, without renal calculi, solid lesion, or hydronephrosis. Thickening of the urinary bladder wall. Stomach/Bowel: Stomach is within normal limits. Appendix appears normal. No evidence of bowel wall thickening, distention, or inflammatory changes. The colon is fluid-filled to the rectum. Vascular/Lymphatic: Aortic atherosclerosis. No enlarged abdominal or pelvic lymph nodes. Reproductive: Prostatomegaly. Other: No abdominal wall hernia or abnormality. No  ascites. Musculoskeletal: No acute or significant osseous findings. Severe bilateral femoroacetabular arthrosis, worse on the left with bone-on-bone joint space loss. IMPRESSION: 1. Very extensive, heterogeneous and consolidative airspace opacity throughout the included lung bases, consistent with multifocal infection or aspiration. 2. No imaging evidence of pancreatitis. 3. The colon is fluid-filled to the rectum, suggestive of diarrheal illness. 4. Hepatic steatosis. 5. Prostatomegaly. Aortic Atherosclerosis (ICD10-I70.0). Electronically Signed   By: Delanna Ahmadi M.D.   On: 06/10/2022 17:40     Labs:   Basic Metabolic Panel: Recent Labs  Lab 06/19/22 0846 06/20/22 0311 06/21/22 0410 06/22/22 0516 06/23/22 0426 06/24/22 0904  NA 136 136 139 138 140 140  K 3.6 3.3* 3.5 3.4* 3.4* 3.6  CL 98 100 103 104 104 106  CO2 26 24 24 25 25 25   GLUCOSE 128* 92 104* 126* 118* 113*  BUN 61* 75* 73* 66* 53* 37*  CREATININE 4.61* 4.84* 4.71* 4.13* 3.15* 2.61*  CALCIUM 8.1* 7.9* 8.1* 8.1* 8.5* 8.1*  PHOS 5.3* 6.0*  --   --   --   --    GFR Estimated Creatinine Clearance: 26.9 mL/min (A) (by C-G formula based on SCr of 2.61 mg/dL (H)). Liver Function Tests: Recent Labs  Lab 06/19/22 0846 06/20/22 0311  ALBUMIN 1.7* 1.8*   No results for input(s): "LIPASE", "AMYLASE" in the last 168 hours. No results for input(s): "AMMONIA" in the last 168 hours. Coagulation profile No results for input(s): "INR", "PROTIME" in the last 168 hours.  CBC: Recent Labs  Lab 06/20/22 0311 06/21/22 0410  WBC 6.2 7.0  NEUTROABS 4.7 5.6  HGB 8.5* 8.3*  HCT 23.2* 22.9*  MCV 93.9 95.4  PLT 274 315   Cardiac Enzymes: No results for input(s): "CKTOTAL", "CKMB", "CKMBINDEX", "TROPONINI" in the last 168 hours. BNP: Invalid input(s): "POCBNP" CBG: Recent Labs  Lab 06/24/22 0735 06/24/22 1115 06/24/22 2147 06/25/22 0729 06/25/22 1130  GLUCAP 88 91 108* 90 119*   D-Dimer No results for input(s):  "DDIMER" in the last 72 hours. Hgb A1c No results for input(s): "HGBA1C" in the last 72 hours. Lipid Profile No results for input(s): "CHOL", "HDL", "LDLCALC", "TRIG", "CHOLHDL", "LDLDIRECT" in the last 72 hours. Thyroid function  studies No results for input(s): "TSH", "T4TOTAL", "T3FREE", "THYROIDAB" in the last 72 hours.  Invalid input(s): "FREET3" Anemia work up No results for input(s): "VITAMINB12", "FOLATE", "FERRITIN", "TIBC", "IRON", "RETICCTPCT" in the last 72 hours. Microbiology No results found for this or any previous visit (from the past 240 hour(s)).  Time coordinating discharge: 35 minutes  Signed: Cayce Paschal  [facility] 06/25/2022, 2:52 PM

## 2022-06-25 NOTE — Progress Notes (Signed)
DISCHARGE NOTE HOME Leon O Mexicano to be discharged Home per MD order. Discussed prescriptions and follow up appointments with the patient. Prescriptions given to patient; medication list explained in detail. Patient verbalized understanding.  Skin clean, dry and intact without evidence of skin break down, no evidence of skin tears noted. IV catheter discontinued intact. Site without signs and symptoms of complications. Dressing and pressure applied. Pt denies pain at the site currently. No complaints noted.  Patient free of lines, drains, and wounds.   An After Visit Summary (AVS) was printed and given to the patient. Patient escorted via wheelchair, and discharged home via private auto.  Keenan Bachelor, RN

## 2022-06-30 ENCOUNTER — Ambulatory Visit (INDEPENDENT_AMBULATORY_CARE_PROVIDER_SITE_OTHER): Payer: BLUE CROSS/BLUE SHIELD

## 2022-06-30 VITALS — BP 101/64 | HR 68 | Temp 98.7°F | Wt 127.8 lb

## 2022-06-30 DIAGNOSIS — F17210 Nicotine dependence, cigarettes, uncomplicated: Secondary | ICD-10-CM

## 2022-06-30 DIAGNOSIS — M109 Gout, unspecified: Secondary | ICD-10-CM

## 2022-06-30 DIAGNOSIS — A403 Sepsis due to Streptococcus pneumoniae: Secondary | ICD-10-CM

## 2022-06-30 DIAGNOSIS — N170 Acute kidney failure with tubular necrosis: Secondary | ICD-10-CM

## 2022-06-30 DIAGNOSIS — F1010 Alcohol abuse, uncomplicated: Secondary | ICD-10-CM | POA: Diagnosis not present

## 2022-06-30 DIAGNOSIS — J189 Pneumonia, unspecified organism: Secondary | ICD-10-CM

## 2022-06-30 DIAGNOSIS — R6520 Severe sepsis without septic shock: Secondary | ICD-10-CM

## 2022-06-30 DIAGNOSIS — N400 Enlarged prostate without lower urinary tract symptoms: Secondary | ICD-10-CM

## 2022-06-30 DIAGNOSIS — J9601 Acute respiratory failure with hypoxia: Secondary | ICD-10-CM

## 2022-06-30 DIAGNOSIS — N179 Acute kidney failure, unspecified: Secondary | ICD-10-CM

## 2022-06-30 DIAGNOSIS — K760 Fatty (change of) liver, not elsewhere classified: Secondary | ICD-10-CM

## 2022-06-30 DIAGNOSIS — Z0000 Encounter for general adult medical examination without abnormal findings: Secondary | ICD-10-CM | POA: Insufficient documentation

## 2022-06-30 MED ORDER — FOLIC ACID 1 MG PO TABS: 1.0000 mg | ORAL_TABLET | Freq: Every day | ORAL | 2 refills | Status: AC

## 2022-06-30 NOTE — Assessment & Plan Note (Signed)
Patient is not currently on any medications for this condition. Patient has not tried medications to help with ETOH cessation previously. Last drink was 3 weeks ago. Patient denies having withdrawal symptoms at this time. Patient is motivated to quit drinking currently after the passing of a friend related to alcohol. Does not want to start medications at this time. Discussed options to help with cravings if he needs it in the future.   Plan: - Continue to monitor need for medication

## 2022-06-30 NOTE — Progress Notes (Signed)
CC: new patient visit  HPI:  [name] is a 52 y.o. male with past medical history of pancreatitis, acute hypoxic respiratory failure, AKI, sepsis due to strep pneumoniae, ETOH abuse, and tobacco use disorder that presents for a new patient visit.   Patient was discharged on 1/18 after being hospitalized for sepsis secondary to bilateral pneumonia. Was found to have blood cultures positive for strep pneumonia. TTE on 1/8 was concerning for endocarditis of mitral valve but was later ruled out by cardiology. Repeat blood cultures were negative. Initially treated w/ IV abx, switched to PO augmentin on 1/16 for 7 days following removal of IJ catheter (for CRRT) on 1/15. Patient reports completing this course of antibiotics. Denies cough, SOB, chest pain, fever, chills, nausea, or vomiting.   Patient has a history of ETOH abuse. He is not currently on any medications for this condition. Patient has not tried medications to help with ETOH cessation previously. Last drink was 3 weeks ago. Patient denies having withdrawal symptoms at this time. Patient is motivated to quit drinking currently after the passing of a friend related to alcohol. Does not want to start medications at this time. Discussed options to help with cravings if he needs it in the future.   Patient has a history of gout in his right big toe. He completed prednisone taper during his recent hospitalization. Pain today is resolved.   Patient is comfortable receiving Hep C screening, Tdap, influenza vaccine, and referral for colonoscopy today.   Patient denies past medical history of HTN, DM, CHF, MI, DVT, stroke, or cancer.   Family history includes: HTN (mom), throat cancer (uncle, maternal great grandmother). Patient denies family history of HTN, DM, CHF, MI, DVT, stroke.   Past surgical history is negative.   Past social history: Patient lives at home with mother and brother. 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  Patient reports smoking 0.5 ppd for 20 years but recently quit. Patient reports drinking 2X 40oz beer per day for 10 years. Denies current or prior recreational drug use.   Allergies as of 06/30/2022   No Known Allergies      Medication List        Accurate as of June 30, 2022  4:30 PM. If you have any questions, ask your nurse or doctor.          acetaminophen 325 MG tablet Commonly known as: TYLENOL Take 2 tablets (650 mg total) by mouth every 6 (six) hours as needed for mild pain (or Fever >/= 101).   CertaVite/Antioxidants Tabs Take 1 tablet by mouth daily.   folic acid 1 MG tablet Commonly known as: FOLVITE Take 1 tablet (1 mg total) by mouth daily.         Past Medical History:  Diagnosis Date   Alcohol abuse    Pancreatitis    Tobacco abuse    Review of Systems:  per HPI.   Physical Exam: Vitals:   06/30/22 1505 06/30/22 1628  BP: (!) 95/51 101/64  Pulse: 74 68  Temp: 98.7 F (37.1 C)   TempSrc: Oral   Weight: 127 lb 12.8 oz (58 kg)    Constitutional: Well-developed, well-nourished, appears comfortable  HENT: Normocephalic and atraumatic.  Eyes: EOM are normal. PERRL.  Neck: Normal range of motion.  Cardiovascular: Regular rate, regular rhythm. No murmurs, rubs, or gallops. Normal radial and PT pulses bilaterally. No LE edema.  Pulmonary: Normal respiratory effort. No wheezes, rales, rhonchi, or crackles.  Abdominal: Soft.  Non-distended. Mild diffuse abdominal tenderness. Normal bowel sounds. No rebound tenderness or guarding.   Musculoskeletal: Normal range of motion.     Neurological: Alert and oriented to person, place, and time. Non-focal. Skin: warm and dry. Healing wound on right neck from recent IJ catheter without surrounding erythema, edema, drainage, or active bleeding.   Assessment & Plan:   See Encounters Tab for problem based charting.  Patient seen with Dr. Heber [place]

## 2022-06-30 NOTE — Assessment & Plan Note (Signed)
Patient recently hospitalized for sepsis in the setting of bilateral pneumonia. Creatinine significantly elevated on admission and improved throughout his stay. ANA's, ANCA, GBM, complement levels were negative. Cr 4.84 ten days ago. Patient was unaware of a prior diagnosis of CKD. Suspect he may have underlying CKD. Will repeat BMP today to assess for improvement of his kidney function.  Plan: - BMP today

## 2022-06-30 NOTE — Assessment & Plan Note (Addendum)
Patient agreed to Hep C screening, Tdap, influenza vaccine, and referral for colonoscopy today however issue with insurance arose at the end of our visit. Will hold off on these until next visit to avoid sending the patient a large bill.

## 2022-06-30 NOTE — Assessment & Plan Note (Signed)
Patient was discharged on 1/18 after being hospitalized for sepsis secondary to bilateral pneumonia. Was found to have blood cultures positive for strep pneumonia. TTE on 1/8 was concerning for endocarditis of mitral valve but was later ruled out by cardiology. Repeat blood cultures were negative. Initially treated w/ IV abx, switched to PO augmentin on 1/16 for 7 days following removal of IJ catheter (for CRRT) on 1/15. Patient reports completing this course of antibiotics. Denies cough, SOB, chest pain, fever, chills, nausea, or vomiting. Afebrile today. Normal lung exam.   Plan: - Repeat CXR in 6 weeks to screen for underlying lung malignancy

## 2022-06-30 NOTE — Assessment & Plan Note (Signed)
Patient was discharged on 1/18 after hospitalization for sepsis. He completed prednisone taper during his stay. Pain today is resolved.   Plan: - Continue to monitor for symptoms

## 2022-06-30 NOTE — Patient Instructions (Addendum)
Thank you for coming to see [place] in [name].   Plan: - We will get repeat imaging of your chest in about 6 weeks to make sure you do not have underlying lung cancer as a cause of your recent pneumonia  - We drew your blood today to check your electrolytes and kidney function (we will call you with these results)  - We drew your blood today to screen for Hepatitis C (we will call you with these results)  - We gave you the tetanus booster and influenza vaccine today  - We referred you to have a colonoscopy done to screen for colon cancer (you will be called to schedule an appointment)  It was very nice to see you, thank you for allowing [place] to be involved in your care.

## 2022-07-01 LAB — BMP8+ANION GAP
Anion Gap: 15 mmol/L (ref 10.0–18.0)
BUN/Creatinine Ratio: 10 (ref 9–20)
BUN: 14 mg/dL (ref 6–24)
CO2: 22 mmol/L (ref 20–29)
Calcium: 8.7 mg/dL (ref 8.7–10.2)
Chloride: 103 mmol/L (ref 96–106)
Creatinine, Ser: 1.42 mg/dL — ABNORMAL HIGH (ref 0.76–1.27)
Glucose: 77 mg/dL (ref 70–99)
Potassium: 4.3 mmol/L (ref 3.5–5.2)
Sodium: 140 mmol/L (ref 134–144)
eGFR: 59 mL/min/{1.73_m2} — ABNORMAL LOW (ref >59)

## 2022-07-06 NOTE — Progress Notes (Signed)
Internal Medicine Clinic Attending  Case discussed with the resident at the time of the visit.  We reviewed the resident's history and exam and pertinent patient test results.  I agree with the assessment, diagnosis, and plan of care documented in the resident's note.  

## 2022-07-07 NOTE — Progress Notes (Signed)
Called patient 3X daily over the last 3 days at multiple numbers without answer. Mailed patient results discussing improved creatinine, likelihood of underlying CKD, and need for monitoring of his kidney function at subsequent visits.

## 2024-04-17 ENCOUNTER — Other Ambulatory Visit (HOSPITAL_COMMUNITY): Payer: Self-pay
